# Patient Record
Sex: Male | Born: 1940 | Race: Asian | Hispanic: No | Marital: Married | Smoking: Never smoker
Health system: Southern US, Community
[De-identification: ages and names within clinical notes are randomized; demographics above are authoritative.]

## PROBLEM LIST (undated history)

## (undated) DIAGNOSIS — K219 Gastro-esophageal reflux disease without esophagitis: Secondary | ICD-10-CM

## (undated) DIAGNOSIS — I10 Essential (primary) hypertension: Secondary | ICD-10-CM

---

## 2008-02-03 ENCOUNTER — Inpatient Hospital Stay (HOSPITAL_COMMUNITY): Admission: EM | Admit: 2008-02-03 | Discharge: 2008-02-05 | Payer: Self-pay | Admitting: Internal Medicine

## 2008-07-02 ENCOUNTER — Encounter: Admission: RE | Admit: 2008-07-02 | Discharge: 2008-07-02 | Payer: Self-pay | Admitting: Pulmonary Disease

## 2008-09-16 ENCOUNTER — Encounter: Admission: RE | Admit: 2008-09-16 | Discharge: 2008-09-16 | Payer: Self-pay | Admitting: Family Medicine

## 2010-07-04 NOTE — Discharge Summary (Signed)
Eric Whitehead, GUM NO.:  [id number]   MEDICAL RECORD NO.:  [id number]          PATIENT TYPE:  INP   LOCATION:  1442                         FACILITY:  [facility]   PHYSICIAN:  Estelle Grumbles, MDDATE OF BIRTH:  1940/08/04   DATE OF ADMISSION:  02/03/2008  DATE OF DISCHARGE:  02/05/2008                               DISCHARGE SUMMARY   DISCHARGE DIAGNOSES:  1. Chest pain likely secondary to the gastroesophageal reflux disease.  2. Mild low back pain.   DISPOSITION:  To home.  Family care physician is going to be [facility].   FOLLOWUP:  March 16, 2008.   DISCHARGE MEDICATIONS:  1. Prilosec 20 mg orally twice a day.  2. Darvocet N-100 one tablet orally q.6 hourly p.r.n. for pain.  3. Maalox 30 mL p.o. q.6 hourly as needed for bloating sensation and      dyspepsia.   DISCHARGE RECOMMENDATIONS:  Increase oral fluid intake, and I have  recommended that patient return to the emergency room if he gets any  further episodes of chest pain or any weakness, tingling, numbness with  worsening back pain.   HOSPITAL COURSE:  Mr. Hargens presented to the emergency room with  intermittent chest discomfort.  He was complaining of having some  bloating sensation in the abdomen, and he was getting the chest pain  when his abdominal symptoms were getting worse.  On the day of  admission, he had some dyspnea along with this chest pain so they were  concerned  about his cardiac condition and decided to come to the  emergency room.  For complete history and physical, please review the H  and P done on December 15th.  The patient was admitted to the telemetry  unit.  Cardiac enzymes x3 were done, which came back negative.  His EKG  remained at normal sinus rhythm.  His telemetry did not reveal any  arrhythmia.  The patient continued to have intermittent chest discomfort  especially with a bloating sensation in the abdomen.  His chest CT  revealed nodular densities in the  bilateral lung fields recommending  followup CT.  The patient does have a history of tobacco use.  He needed  to have a repeat CT chest in the next 3 to 6 months.  His chest pain  seems to be secondary to the GI origin.  It is improving with acid  medication and Maalox.  We will discharge him home on antacids.  For his  low back pain, we will start him on Darvocet N-100.  We have arranged  followup care with [facility].      Estelle Grumbles, MD  Electronically Signed    TP/MEDQ  D:  02/05/2008  T:  02/06/2008  Job:  [id number]   cc:   [facility]

## 2010-07-04 NOTE — H&P (Signed)
[name], Whitehead NO.:  [id number]   MEDICAL RECORD NO.:  [id number]          PATIENT TYPE:  EMS   LOCATION:  MAJO                         FACILITY:  [facility]   PHYSICIAN:  Theodosia Paling, MD    DATE OF BIRTH:  01/14/1941   DATE OF ADMISSION:  02/03/2008  DATE OF DISCHARGE:                              HISTORY & PHYSICAL   PRIMARY CARE PHYSICIAN:  The patient has been in the [place] the  last one month and does not have a primary care physician at this time.   CHIEF COMPLAINT:  Chest pain.   HISTORY OF PRESENT ILLNESS:  The patient is from [place], recently moved  to [place] around a month back.  His history is getting translated by  his chaplain, who is standing next to him.  According to him, Mr. Eric Whitehead is  a 70 year old gentleman from [place] who traveled from [place] in a  prolonged  flight in the first week of November.  He has been  experiencing the sudden onset of chest pain yesterday which increases  when he takes a deep breath.  He tried to take some pain medication that  did not bring any relief.  He has mild shortness of breath associated  with that, as well.  He did not have something similar in the past.  Denies any other symptoms.  The patient also has a history of tobacco  abuse.  The patient has never seen a doctor in the past.  Because of the  persistent chest pain, he presented to the emergency room for further  evaluation and management and subsequently the [facility] hospitalist  team was consulted.   PAST MEDICAL HISTORY:  According to the translator, the patient does not  have any past medical condition.   PAST SURGICAL HISTORY:  None.   ALLERGIES:  None.   HOME MEDICATIONS:  None.   SOCIAL HISTORY:  Denies IV drug abuse or alcohol abuse.   FAMILY HISTORY:  Denies any family history of coronary artery disease,  hypertension or diabetes mellitus.   PHYSICAL EXAMINATION:  VITAL SIGNS:  Heart rate 64, respiratory rate 15-  18,  oxygen saturation 99% on room air, blood pressure 128/70.  GENERAL:  No acute cardiorespiratory distress.  HEENT:  Pupils equal, round and reactive to light.  Extraocular  movements are intact.  No ear or nose discharge.  LUNGS:  Normal breath sounds.  CARDIOVASCULAR:  S1 and S2 normal.  No murmur or gallop heard.  No rub  heard.  GI:  Soft, nontender.  No organomegaly.  Normal bowel sounds.  SKIN:  No rash.  EXTREMITIES:  No pedal edema.  PSYCHIATRIC:  Oriented x3.  CNS:  Follows commands.  Motor and sensory intact.   LABORATORY DATA:  WBC of 5.9, hemoglobin 13.2, hematocrit 39.2, platelet  count 210.  Sodium 141, potassium 4.1, chloride 105, glucose 84, BUN 11,  creatinine 1.2.  Cardiac enzymes - first set negative.  CK-MB less than  1.2.  Troponin less than 0.05.  Myoglobin 91.9.  Ionized calcium 1.18,  bicarbonate 27.  ASSESSMENT AND PLAN:  1. Chest pain.  The patient has pleuritic component of the pain, and      he recently had a pretty extensive flight, so we will rule him out      for pulmonary embolism.  He is a tobacco abuser.  Will also cycle      cardiac enzymes.  A low-dose of cardioprotective medication is      started, as well, including aspirin and a small dose of Coreg.  A      fasting lipid profile will be checked.  P.R.N. nitroglycerin and      p.r.n. morphine is requested.  At this time, the patient does not      appear to be in cardiorespiratory distress.  He will be admitted      under telemetry.  2. Tobacco abuse.  A nicotine patch will be supplied to the patient.  3. Prophylaxis.  DVT and GI prophylaxis on board.  4. Code status.  The patient is full code.   Total time spent on admission of this patient around 45 minutes.      Theodosia Paling, MD  Electronically Signed     NP/MEDQ  D:  02/03/2008  T:  02/03/2008  Job:  [id number]

## 2010-11-24 LAB — CBC
HCT: 36.8 % — ABNORMAL LOW (ref 39.0–52.0)
HCT: 37 % — ABNORMAL LOW (ref 39.0–52.0)
Hemoglobin: 12.4 g/dL — ABNORMAL LOW (ref 13.0–17.0)
Hemoglobin: 12.6 g/dL — ABNORMAL LOW (ref 13.0–17.0)
Hemoglobin: 13.2 g/dL (ref 13.0–17.0)
MCHC: 33.8 g/dL (ref 30.0–36.0)
MCV: 90 fL (ref 78.0–100.0)
MCV: 90.1 fL (ref 78.0–100.0)
Platelets: 188 10*3/uL (ref 150–400)
Platelets: 210 10*3/uL (ref 150–400)
RBC: 4.11 MIL/uL — ABNORMAL LOW (ref 4.22–5.81)
RDW: 12.5 % (ref 11.5–15.5)
RDW: 12.9 % (ref 11.5–15.5)
WBC: 5.9 10*3/uL (ref 4.0–10.5)
WBC: 7.1 10*3/uL (ref 4.0–10.5)

## 2010-11-24 LAB — BASIC METABOLIC PANEL
BUN: 13 mg/dL (ref 6–23)
CO2: 30 mEq/L (ref 19–32)
Chloride: 102 mEq/L (ref 96–112)
Chloride: 104 mEq/L (ref 96–112)
GFR calc Af Amer: >60 mL/min (ref >60)
GFR calc non Af Amer: >60 mL/min (ref >60)
GFR calc non Af Amer: >60 mL/min (ref >60)
Glucose, Bld: 104 mg/dL — ABNORMAL HIGH (ref 70–99)
Potassium: 3.7 mEq/L (ref 3.5–5.1)
Potassium: 4 mEq/L (ref 3.5–5.1)
Sodium: 137 mEq/L (ref 135–145)
Sodium: 138 mEq/L (ref 135–145)

## 2010-11-24 LAB — LIPID PANEL
HDL: 32 mg/dL — ABNORMAL LOW (ref >39)
Total CHOL/HDL Ratio: 6.5 RATIO
Triglycerides: 143 mg/dL (ref <150)

## 2010-11-24 LAB — CARDIAC PANEL(CRET KIN+CKTOT+MB+TROPI)
Relative Index: INVALID (ref 0.0–2.5)
Relative Index: INVALID (ref 0.0–2.5)
Total CK: 54 U/L (ref 7–232)
Total CK: 59 U/L (ref 7–232)
Troponin I: <0.01 ng/mL (ref 0.00–0.06)
Troponin I: <0.01 ng/mL (ref 0.00–0.06)

## 2010-11-24 LAB — DIFFERENTIAL
Basophils Absolute: 0 10*3/uL (ref 0.0–0.1)
Lymphocytes Relative: 37 % (ref 12–46)
Neutro Abs: 2.8 10*3/uL (ref 1.7–7.7)

## 2010-11-24 LAB — POCT I-STAT, CHEM 8
Chloride: 105 mEq/L (ref 96–112)
HCT: 40 % (ref 39.0–52.0)
Potassium: 4.1 mEq/L (ref 3.5–5.1)

## 2010-11-24 LAB — POCT CARDIAC MARKERS
CKMB, poc: <1 ng/mL — ABNORMAL LOW (ref 1.0–8.0)
Troponin i, poc: <0.05 ng/mL (ref 0.00–0.09)

## 2010-11-24 LAB — D-DIMER, QUANTITATIVE: D-Dimer, Quant: 0.4 ug/mL-FEU (ref 0.00–0.48)

## 2011-06-24 ENCOUNTER — Encounter (HOSPITAL_COMMUNITY): Payer: Self-pay | Admitting: Emergency Medicine

## 2011-06-24 ENCOUNTER — Emergency Department (HOSPITAL_COMMUNITY): Payer: Medicaid Other

## 2011-06-24 ENCOUNTER — Inpatient Hospital Stay (HOSPITAL_COMMUNITY)
Admission: EM | Admit: 2011-06-24 | Discharge: 2011-06-26 | DRG: 185 | Disposition: A | Discharge status: Home / Self Care | Payer: Medicaid Other | Attending: Internal Medicine | Admitting: Internal Medicine

## 2011-06-24 DIAGNOSIS — W010XXA Fall on same level from slipping, tripping and stumbling without subsequent striking against object, initial encounter: Secondary | ICD-10-CM | POA: Diagnosis present

## 2011-06-24 DIAGNOSIS — S2249XA Multiple fractures of ribs, unspecified side, initial encounter for closed fracture: Principal | ICD-10-CM | POA: Diagnosis present

## 2011-06-24 DIAGNOSIS — S2239XA Fracture of one rib, unspecified side, initial encounter for closed fracture: Secondary | ICD-10-CM

## 2011-06-24 DIAGNOSIS — Y93E1 Activity, personal bathing and showering: Secondary | ICD-10-CM

## 2011-06-24 DIAGNOSIS — Y999 Unspecified external cause status: Secondary | ICD-10-CM

## 2011-06-24 DIAGNOSIS — R55 Syncope and collapse: Secondary | ICD-10-CM | POA: Diagnosis present

## 2011-06-24 DIAGNOSIS — Z79899 Other long term (current) drug therapy: Secondary | ICD-10-CM

## 2011-06-24 DIAGNOSIS — Y92009 Unspecified place in unspecified non-institutional (private) residence as the place of occurrence of the external cause: Secondary | ICD-10-CM

## 2011-06-24 HISTORY — DX: Gastro-esophageal reflux disease without esophagitis: K21.9

## 2011-06-24 LAB — DIFFERENTIAL
Basophils Absolute: 0 10*3/uL (ref 0.0–0.1)
Basophils Relative: 0 % (ref 0–1)
Lymphocytes Relative: 21 % (ref 12–46)
Monocytes Absolute: 0.7 10*3/uL (ref 0.1–1.0)
Neutro Abs: 7.5 10*3/uL (ref 1.7–7.7)

## 2011-06-24 LAB — URINALYSIS, ROUTINE W REFLEX MICROSCOPIC
Ketones, ur: NEGATIVE mg/dL
Leukocytes, UA: NEGATIVE
Nitrite: NEGATIVE
Protein, ur: NEGATIVE mg/dL
pH: 7.5 (ref 5.0–8.0)

## 2011-06-24 LAB — BASIC METABOLIC PANEL
CO2: 27 mEq/L (ref 19–32)
Calcium: 9.2 mg/dL (ref 8.4–10.5)
Chloride: 97 mEq/L (ref 96–112)
Creatinine, Ser: 0.89 mg/dL (ref 0.50–1.35)
GFR calc Af Amer: >90 mL/min (ref >90)
Sodium: 132 mEq/L — ABNORMAL LOW (ref 135–145)

## 2011-06-24 LAB — POCT I-STAT, CHEM 8
Calcium, Ion: 1.23 mmol/L (ref 1.12–1.32)
Glucose, Bld: 100 mg/dL — ABNORMAL HIGH (ref 70–99)
HCT: 40 % (ref 39.0–52.0)
Hemoglobin: 13.6 g/dL (ref 13.0–17.0)
TCO2: 28 mmol/L (ref 0–100)

## 2011-06-24 LAB — CBC
HCT: 38.9 % — ABNORMAL LOW (ref 39.0–52.0)
MCHC: 33.9 g/dL (ref 30.0–36.0)
Platelets: 255 10*3/uL (ref 150–400)
RDW: 12.9 % (ref 11.5–15.5)
WBC: 10.7 10*3/uL — ABNORMAL HIGH (ref 4.0–10.5)

## 2011-06-24 MED ORDER — SODIUM CHLORIDE 0.9 % IV SOLN
Freq: Once | INTRAVENOUS | Status: AC
  Administered 2011-06-24: 22:00:00 via INTRAVENOUS

## 2011-06-24 MED ORDER — IOHEXOL 300 MG/ML  SOLN
100.0000 mL | Freq: Once | INTRAMUSCULAR | Status: AC | PRN
  Administered 2011-06-24: 100 mL via INTRAVENOUS

## 2011-06-24 MED ORDER — ONDANSETRON HCL 4 MG/2ML IJ SOLN
4.0000 mg | Freq: Once | INTRAMUSCULAR | Status: AC
  Administered 2011-06-24: 4 mg via INTRAVENOUS
  Filled 2011-06-24: qty 2

## 2011-06-24 MED ORDER — HYDROMORPHONE HCL PF 1 MG/ML IJ SOLN
1.0000 mg | Freq: Once | INTRAMUSCULAR | Status: AC
  Administered 2011-06-24: 1 mg via INTRAVENOUS
  Filled 2011-06-24: qty 1

## 2011-06-24 NOTE — ED Notes (Signed)
EKG performed and given to ERP Allen along with previous EKG 

## 2011-06-24 NOTE — H&P (Addendum)
PCP:  Billee Cashing with [facility]   Chief Complaint:  Fall  HPI: This is a 71 year old male with a no significant past medical history. he was in the shower today when he slipped and fell. He states he is a brief loss of consciousness after he fell. He was a non-witnessed fall. He quickly recovered and got help from family in the household. He had left side chest pains after the fall. The patient has fractured ribs. He states he did not hit his head. There is unclear history of possible chest pains prior to the fall. He reports no palpitations, no nausea or vomiting. He does report some shortness of breath. History provided by the patient through a translator his son-in-law. The patient speaks [place] and [place]. His son-in-law's name is Dom [phone].  Review of Systems: Positives bolded anorexia, fever, weight loss,, vision loss, decreased hearing, hoarseness, chest pain, syncope, dyspnea on exertion, peripheral edema, balance deficits, hemoptysis, abdominal pain, melena, hematochezia, severe indigestion/heartburn, hematuria, incontinence, genital sores, muscle weakness, suspicious skin lesions, transient blindness, difficulty walking, depression, unusual weight change, abnormal bleeding, enlarged lymph nodes, angioedema, and breast masses.  Past Medical History: Past Medical History  Diagnosis Date  . GERD (gastroesophageal reflux disease)    History reviewed. No pertinent past surgical history.  Medications: Prior to Admission medications   Medication Sig Start Date End Date Taking? Authorizing Provider  esomeprazole (NEXIUM) 40 MG capsule Take 40 mg by mouth daily before breakfast.   Yes Historical Provider, MD  fluticasone (VERAMYST) 27.5 MCG/SPRAY nasal spray Place 2 sprays into the nose daily. As directed   Yes Historical Provider, MD  loratadine (CLARITIN) 10 MG tablet Take 10 mg by mouth at bedtime.   Yes Historical Provider, MD    Pseudoeph-Doxylamine-DM-APAP (NYQUIL MULTI-SYMPTOM PO) Take 15 mLs by mouth every 6 (six) hours as needed. For cold   Yes Historical Provider, MD    Allergies:  No Known Allergies  Social History:  reports that he has never smoked. He does not have any smokeless tobacco history on file. He reports that he does not drink alcohol or use illicit drugs.  Family History: No family history on file.  Physical Exam: Filed Vitals:   06/24/11 2130 06/24/11 2145 06/24/11 2200 06/24/11 2215  BP:      Pulse: 80 80 78 79  Temp:      TempSrc:      Resp: 22 20 24 25   SpO2: 100% 100% 100% 100%    General:  Alert and oriented times three, well developed and nourished, no acute distress Eyes: PERRLA, pink conjunctiva, no scleral icterus ENT: Moist oral mucosa, neck supple, no thyromegaly Lungs: clear to ascultation, no wheeze, no crackles, no use of accessory muscles Cardiovascular: regular rate and rhythm, no regurgitation, no gallops, no murmurs. No carotid bruits, no JVD Abdomen: soft, positive BS, non-tender, non-distended, no organomegaly, not an acute abdomen GU: not examined Neuro: CN II - XII grossly intact, sensation intact Musculoskeletal: strength 5/5 all extremities, no clubbing, cyanosis or edema, tenderness to palpation left ribs  Skin: no rash, no subcutaneous crepitation, no decubitus Psych: appropriate patient   Labs on Admission:   [facility] 06/24/11 2111 06/24/11 2100  NA 137 132*  K 4.6 4.8  CL 104 97  CO2 -- 27  GLUCOSE 100* 97  BUN 19 18  CREATININE 1.00 0.89  CALCIUM -- 9.2  MG -- --  PHOS -- --   No results found for this basename: AST:2,ALT:2,ALKPHOS:2,BILITOT:2,PROT:2,ALBUMIN:2 in the  last 72 hours No results found for this basename: LIPASE:2,AMYLASE:2 in the last 72 hours  Basename 06/24/11 2111 06/24/11 2100  WBC -- 10.7*  NEUTROABS -- 7.5  HGB 13.6 13.2  HCT 40.0 38.9*  MCV -- 87.2  PLT -- 255   No results found for this basename:  CKTOTAL:3,CKMB:3,CKMBINDEX:3,TROPONINI:3 in the last 72 hours No components found with this basename: POCBNP:3 No results found for this basename: DDIMER:2 in the last 72 hours No results found for this basename: HGBA1C:2 in the last 72 hours No results found for this basename: CHOL:2,HDL:2,LDLCALC:2,TRIG:2,CHOLHDL:2,LDLDIRECT:2 in the last 72 hours No results found for this basename: TSH,T4TOTAL,FREET3,T3FREE,THYROIDAB in the last 72 hours No results found for this basename: VITAMINB12:2,FOLATE:2,FERRITIN:2,TIBC:2,IRON:2,RETICCTPCT:2 in the last 72 hours  Micro Results: No results found for this or any previous visit (from the past 240 hour(s)). Results for NANA, HOSELTON (MRN [id number]) as of 06/24/2011 23:21  Ref. Range 06/24/2011 22:46  Color, Urine Latest Range: YELLOW  YELLOW  APPearance Latest Range: CLEAR  CLEAR  Specific Gravity, Urine Latest Range: 1.005-1.030  1.023  pH Latest Range: 5.0-8.0  7.5  Glucose, UA Latest Range: NEGATIVE mg/dL NEGATIVE  Bilirubin Urine Latest Range: NEGATIVE  NEGATIVE  Ketones, ur Latest Range: NEGATIVE mg/dL NEGATIVE  Protein Latest Range: NEGATIVE mg/dL NEGATIVE  Urobilinogen, UA Latest Range: 0.0-1.0 mg/dL 0.2  Nitrite Latest Range: NEGATIVE  NEGATIVE  Leukocytes, UA Latest Range: NEGATIVE  NEGATIVE  Hgb urine dipstick Latest Range: NEGATIVE  NEGATIVE    Radiological Exams on Admission: Dg Ribs Unilateral W/chest Left  06/24/2011  *RADIOLOGY REPORT*  Clinical Data: Mid to lower left rib pain.  LEFT RIBS AND CHEST - 3+ VIEW  Comparison: Chest radiograph performed 07/02/2008, and CT of the chest performed 09/16/2008  Findings: There are displaced fractures of the left sixth and seventh lateral ribs, and left eighth through tenth anterolateral ribs.  Lung expansion is decreased.  Mild vascular crowding and vascular congestion are noted, without significant pulmonary edema.  Mild bibasilar atelectasis is seen.  No pleural effusion or pneumothorax is identified.   The cardiomediastinal silhouette is borderline normal in size.  No additional osseous abnormalities are identified.  IMPRESSION:  1.  Displaced fractures of the left sixth and seventh lateral ribs, and of the left eighth through tenth anterolateral ribs. 2.  Lungs hypoexpanded; mild vascular congestion noted, and mild bibasilar atelectasis seen.  Original Report Authenticated By: Tonia Ghent, M.D.   Ct Head Wo Contrast  06/24/2011  *RADIOLOGY REPORT*  Clinical Data: Headache; status post fall.  CT HEAD WITHOUT CONTRAST  Technique:  Contiguous axial images were obtained from the base of the skull through the vertex without contrast.  Comparison: None.  Findings: There is no evidence of acute infarction, mass lesion, or intra- or extra-axial hemorrhage on CT.  The posterior fossa, including the cerebellum, brainstem and fourth ventricle, is within normal limits.  The third and lateral ventricles, and basal ganglia are unremarkable in appearance.  The cerebral hemispheres are symmetric in appearance, with normal gray- white differentiation.  No mass effect or midline shift is seen.  There is no evidence of fracture; visualized osseous structures are unremarkable in appearance.  The orbits are within normal limits. The paranasal sinuses and mastoid air cells are well-aerated.  No significant soft tissue abnormalities are seen.  IMPRESSION: No evidence of traumatic intracranial injury or fracture.  Original Report Authenticated By: Tonia Ghent, M.D.   Ct Chest W Contrast  06/24/2011  *RADIOLOGY REPORT*  Clinical Data: Left-sided chest  pain after fall.  Rib fracture.  CT CHEST WITH CONTRAST  Technique:  Multidetector CT imaging of the chest was performed following the standard protocol during bolus administration of intravenous contrast.  Contrast: OMNIPAQUE IOHEXOL 300 MG/ML  SOLN  Comparison: Chest x-rays from earlier the same day.  CT chest from 09/16/2008.  Findings: There is no axillary, mediastinal,  or hilar lymphadenopathy.  Heart size is normal.  Coronary artery calcification is evident.  There is no pericardial or pleural effusion.  Lung windows show some pleural parenchymal scarring in the apices. No pneumothorax.  No focal airspace consolidation.  Calcified granuloma noted in the posterior right upper lobe.  Compressive atelectasis is seen in both lower lobes.  6 mm right lower lobe pulmonary nodules seen on image 32 is unchanged in the interval since the prior CT.  Bone windows reveal no worrisome lytic or sclerotic osseous lesions.  A segmental fracture of the anterior left seventh rib is identified.  There is a fracture of the anterior left sixth rib. The 8th through 12th ribs have not been completely visualized.  No evidence for thoracic spine fracture.  IMPRESSION: Acute fracture of the anterior left sixth rib.  There is an acute segmental fracture of the anterior left seventh rib.  The seventh rib is the lowest rib visualized in this region.  No evidence for associated left pneumothorax.  Original Report Authenticated By: ERIC A. MANSELL, M.D.    EKG: Normal sinus rhythm  Assessment/Plan Present on Admission:  .Syncope and collapse .Rib fracture Pain control Admit to telemetry Scheduled and when necessary medications Pulmonary toilet 2-D echo, carotid ultrasounds Cycle cardiac enzymes GERD Continue Protonix   Full code DVT prophylaxis Team 4/Dr. Loura Pardon, Lorrena Goranson 06/24/2011, 11:18 PM

## 2011-06-24 NOTE — ED Provider Notes (Addendum)
History     CSN: [id number]  Arrival date & time 06/24/11  4782   First MD Initiated Contact with Patient 06/24/11 1948      Chief Complaint  Patient presents with  . Chest Pain    (Consider location/radiation/quality/duration/timing/severity/associated sxs/prior treatment) Patient is a 71 y.o. male presenting with chest pain. The history is provided by the patient and a relative.  Chest Pain    patient here after slipping and falling in the shower striking the left chest. Patient lost consciousness for approximately 5 minutes. He denies headache or neck pain. Does note some left-sided chest pain worse with movement and inspiration. No prodromal symptoms prior to his slipping and falling. Denies abdominal pain or shoulder pain. No recent illnesses  History reviewed. No pertinent past medical history.  History reviewed. No pertinent past surgical history.  No family history on file.  History  Substance Use Topics  . Smoking status: Not on file  . Smokeless tobacco: Not on file  . Alcohol Use: No      Review of Systems  Cardiovascular: Positive for chest pain.  All other systems reviewed and are negative.    Allergies  Review of patient's allergies indicates no known allergies.  Home Medications   Current Outpatient Rx  Name Route Sig Dispense Refill  . ESOMEPRAZOLE MAGNESIUM 40 MG PO CPDR Oral Take 40 mg by mouth daily before breakfast.    . FLUTICASONE FUROATE 27.5 MCG/SPRAY NA SUSP Nasal Place 2 sprays into the nose daily. As directed    . LORATADINE 10 MG PO TABS Oral Take 10 mg by mouth at bedtime.    Doreatha Martin MULTI-SYMPTOM PO Oral Take 15 mLs by mouth every 6 (six) hours as needed. For cold      BP 128/84  Pulse 81  Temp(Src) 98.2 F (36.8 C) (Oral)  Resp 18  SpO2 100%  Physical Exam  Nursing note and vitals reviewed. Constitutional: He is oriented to person, place, and time. He appears well-developed and well-nourished.  Non-toxic appearance. No  distress.  HENT:  Head: Normocephalic and atraumatic.  Eyes: Conjunctivae, EOM and lids are normal. Pupils are equal, round, and reactive to light.  Neck: Normal range of motion. Neck supple. No spinous process tenderness and no muscular tenderness present. No tracheal deviation present. No mass present.  Cardiovascular: Normal rate, regular rhythm and normal heart sounds.  Exam reveals no gallop.   No murmur heard. Pulmonary/Chest: Effort normal and breath sounds normal. No stridor. No respiratory distress. He has no decreased breath sounds. He has no wheezes. He has no rhonchi. He has no rales. He exhibits tenderness. He exhibits no crepitus.    Abdominal: Soft. Normal appearance and bowel sounds are normal. He exhibits no distension. There is no tenderness. There is no rigidity, no rebound, no guarding and no CVA tenderness.  Musculoskeletal: Normal range of motion. He exhibits no edema and no tenderness.  Neurological: He is alert and oriented to person, place, and time. He has normal strength. No cranial nerve deficit or sensory deficit. GCS eye subscore is 4. GCS verbal subscore is 5. GCS motor subscore is 6.  Skin: Skin is warm and dry. No abrasion and no rash noted.  Psychiatric: He has a normal mood and affect. His speech is normal and behavior is normal.    ED Course  Procedures (including critical care time)  Labs Reviewed - No data to display No results found.   No diagnosis found.    MDM  Date: 06/24/2011  Rate: 79  Rhythm: normal sinus rhythm  QRS Axis: normal  Intervals: normal  ST/T Wave abnormalities: normal  Conduction Disutrbances:none  Narrative Interpretation:   Old EKG Reviewed: unchanged  10:46 PM Spoke with trauma surgeon and agrees to medical admission, pain meds given and pt to be admitted        Toy Baker, MD 06/24/11 2247  Toy Baker, MD 06/24/11 2249

## 2011-06-24 NOTE — ED Notes (Signed)
Per pt/family, fell in shower increased pain on left side

## 2011-06-24 NOTE — ED Notes (Signed)
Pt states he was taking a shower when he slipped and fell. Pt states he landed on his left side and c/o left rib/side pain. Pt denies hitting head or discomfort anywhere else in the body.

## 2011-06-25 ENCOUNTER — Encounter (HOSPITAL_COMMUNITY): Payer: Self-pay

## 2011-06-25 DIAGNOSIS — S2239XA Fracture of one rib, unspecified side, initial encounter for closed fracture: Secondary | ICD-10-CM

## 2011-06-25 DIAGNOSIS — R55 Syncope and collapse: Secondary | ICD-10-CM

## 2011-06-25 LAB — BASIC METABOLIC PANEL
CO2: 24 mEq/L (ref 19–32)
Chloride: 102 mEq/L (ref 96–112)
Creatinine, Ser: 0.81 mg/dL (ref 0.50–1.35)
GFR calc Af Amer: >90 mL/min (ref >90)
Sodium: 136 mEq/L (ref 135–145)

## 2011-06-25 LAB — CBC
HCT: 37.1 % — ABNORMAL LOW (ref 39.0–52.0)
MCV: 86.9 fL (ref 78.0–100.0)
Platelets: 222 10*3/uL (ref 150–400)
RBC: 4.27 MIL/uL (ref 4.22–5.81)
RDW: 13.2 % (ref 11.5–15.5)
WBC: 8.3 10*3/uL (ref 4.0–10.5)

## 2011-06-25 LAB — TROPONIN I
Troponin I: <0.3 ng/mL (ref <0.30)
Troponin I: <0.3 ng/mL (ref <0.30)

## 2011-06-25 MED ORDER — ACETAMINOPHEN 650 MG RE SUPP: 650.0000 mg | Freq: Four times a day (QID) | RECTAL | Status: DC | PRN

## 2011-06-25 MED ORDER — HYDROCODONE-ACETAMINOPHEN 5-325 MG PO TABS
1.0000 | ORAL_TABLET | ORAL | Status: DC | PRN
  Administered 2011-06-25 (×4): 2 via ORAL
  Filled 2011-06-25 (×4): qty 2

## 2011-06-25 MED ORDER — FLUTICASONE PROPIONATE 50 MCG/ACT NA SUSP
1.0000 | Freq: Every day | NASAL | Status: DC
  Administered 2011-06-25 – 2011-06-26 (×2): 1 via NASAL
  Filled 2011-06-25: qty 16

## 2011-06-25 MED ORDER — ALBUTEROL SULFATE (5 MG/ML) 0.5% IN NEBU: 2.5000 mg | INHALATION_SOLUTION | RESPIRATORY_TRACT | Status: DC | PRN

## 2011-06-25 MED ORDER — FLUTICASONE FUROATE 27.5 MCG/SPRAY NA SUSP: 2.0000 | Freq: Every day | NASAL | Status: DC

## 2011-06-25 MED ORDER — SODIUM CHLORIDE 0.9 % IJ SOLN
3.0000 mL | Freq: Two times a day (BID) | INTRAMUSCULAR | Status: DC
  Administered 2011-06-25 – 2011-06-26 (×3): 3 mL via INTRAVENOUS

## 2011-06-25 MED ORDER — IPRATROPIUM BROMIDE 0.02 % IN SOLN: 0.5000 mg | RESPIRATORY_TRACT | Status: DC | PRN

## 2011-06-25 MED ORDER — LORATADINE 10 MG PO TABS
10.0000 mg | ORAL_TABLET | Freq: Every day | ORAL | Status: DC
  Administered 2011-06-25: 10 mg via ORAL
  Filled 2011-06-25 (×2): qty 1

## 2011-06-25 MED ORDER — PANTOPRAZOLE SODIUM 40 MG PO TBEC
40.0000 mg | DELAYED_RELEASE_TABLET | Freq: Every day | ORAL | Status: DC
  Administered 2011-06-25 – 2011-06-26 (×2): 40 mg via ORAL
  Filled 2011-06-25 (×2): qty 1

## 2011-06-25 MED ORDER — MORPHINE SULFATE 2 MG/ML IJ SOLN: 2.0000 mg | INTRAMUSCULAR | Status: DC | PRN

## 2011-06-25 MED ORDER — ONDANSETRON HCL 4 MG/2ML IJ SOLN
4.0000 mg | Freq: Four times a day (QID) | INTRAMUSCULAR | Status: DC | PRN
  Administered 2011-06-25: 4 mg via INTRAVENOUS
  Filled 2011-06-25: qty 2

## 2011-06-25 MED ORDER — ONDANSETRON HCL 4 MG PO TABS: 4.0000 mg | ORAL_TABLET | Freq: Four times a day (QID) | ORAL | Status: DC | PRN

## 2011-06-25 MED ORDER — SODIUM CHLORIDE 0.9 % IJ SOLN: 3.0000 mL | INTRAMUSCULAR | Status: DC | PRN

## 2011-06-25 MED ORDER — ACETAMINOPHEN 325 MG PO TABS: 650.0000 mg | ORAL_TABLET | Freq: Four times a day (QID) | ORAL | Status: DC | PRN

## 2011-06-25 NOTE — Progress Notes (Signed)
VASCULAR LAB PRELIMINARY  PRELIMINARY  PRELIMINARY  PRELIMINARY  Carotid duplex  completed.    Preliminary report:  Bilateral:  No evidence of hemodynamically significant internal carotid artery stenosis.   Vertebral artery flow is antegrade.      Terance Hart, RVT 06/25/2011, 10:26 AM

## 2011-06-25 NOTE — Progress Notes (Signed)
   CARE MANAGEMENT NOTE 06/25/2011  Patient:  EDREES, VALENT   Account Number:  [id number]  Date Initiated:  06/25/2011  Documentation initiated by:  Lanier Clam  Subjective/Objective Assessment:   ADMITTED W/FALL.SYNCOPE     Action/Plan:   FROM HOME.DOES NOT SPEAK ENGLISH[place].INTEPRETER REFERRED.   Anticipated DC Date:  06/28/2011   Anticipated DC Plan:  HOME/SELF CARE  In-house referral  Interpreting Services         Choice offered to / List presented to:             Status of service:  In process, will continue to follow Medicare Important Message given?   (If response is "NO", the following Medicare IM given date fields will be blank) Date Medicare IM given:   Date Additional Medicare IM given:    Discharge Disposition:    Per UR Regulation:  Reviewed for med. necessity/level of care/duration of stay  If discussed at Long Length of Stay Meetings, dates discussed:    Comments:  06/25/11 [facility] RN,BSN [phone] [phone] ONSITE INTERPRETER TO ASSIST W/COMMUNICATION OF PLANS.ALSO TELEPHONIC INTERPRETING INFO FOR COMMUNICATION ON CHART IF NEEDED.MD/STAFF UPDATED.

## 2011-06-25 NOTE — Progress Notes (Signed)
*  PRELIMINARY RESULTS* Echocardiogram 2D Echocardiogram has been performed.  Eric Whitehead R 06/25/2011, 11:24 AM

## 2011-06-25 NOTE — Evaluation (Signed)
Physical Therapy Evaluation Patient Details Name: Eric Whitehead MRN: [id number] DOB: 02-02-1941 Today's Date: 06/25/2011 Time: 4098-1191 PT Time Calculation (min): 18 min  PT Assessment / Plan / Recommendation Clinical Impression  71 y.o. with fall in shower and multiple L rib fxs, presents with pain in L ribs which limits mobility. Instructed pt to brace ribs with pillow. Expect pt can DC home without f/u PT. Lives with supportive family. Checked orthostatic BP which was normal.    PT Assessment  Patient needs continued PT services    Follow Up Recommendations  No PT follow up    Equipment Recommendations  None recommended by PT    Frequency Min 3X/week    Precautions / Restrictions Precautions Precautions: Fall Precaution Comments: admitted with fall, family states only other fall was 4 yrs ago when pt slipped    Pertinent Vitals/Pain *2/10 L ribs, instructed in bracing with pillow**      Mobility  Bed Mobility Bed Mobility: Supine to Sit Supine to Sit: 4: Min assist;With rails;HOB flat Details for Bed Mobility Assistance: pt 80 %, limited due to pain, assist to elevate trunk Transfers Transfers: Sit to Stand;Stand to Sit Sit to Stand: 6: Modified independent (Device/Increase time);With upper extremity assist;From bed Stand to Sit: 6: Modified independent (Device/Increase time);With upper extremity assist;To chair/3-in-1 Ambulation/Gait Ambulation/Gait Assistance: 5: Supervision Ambulation Distance (Feet): 200 Feet Assistive device: None Ambulation/Gait Assistance Details: steady, no LOB Gait Pattern: Within Functional Limits    Exercises     PT Goals Acute Rehab PT Goals PT Goal Formulation: With patient/family Time For Goal Achievement: 07/02/11 Potential to Achieve Goals: Good Pt will go Supine/Side to Sit: Independently PT Goal: Supine/Side to Sit - Progress: Goal set today Pt will go Sit to Stand: Independently PT Goal: Sit to Stand - Progress: Goal set  today Pt will Perform Home Exercise Program: Independently PT Goal: Perform Home Exercise Program - Progress: Goal set today  Visit Information  Last PT Received On: 06/25/11 Assistance Needed: +1    Subjective Data  Subjective: Pt indicated L ribs are sore thru interpreter Patient Stated Goal: decrease pain   Prior Functioning  Home Living Lives With: Spouse Available Help at Discharge: Family Home Layout: One level Prior Function Level of Independence: Independent Able to Take Stairs?: Yes Communication Communication: Prefers language other than [place];Interpreter utilized Dominant Hand: Right    Cognition  Overall Cognitive Status: Appears within functional limits for tasks assessed/performed Arousal/Alertness: Awake/alert Orientation Level: Appears intact for tasks assessed Behavior During Session: [facility] for tasks performed    Extremity/Trunk Assessment Right Upper Extremity Assessment RUE ROM/Strength/Tone: Within functional levels RUE Sensation: WFL - Light Touch RUE Coordination: WFL - gross/fine motor Left Upper Extremity Assessment LUE ROM/Strength/Tone: Due to pain;Unable to fully assess LUE Sensation: WFL - Light Touch LUE Coordination: WFL - gross/fine motor Right Lower Extremity Assessment RLE ROM/Strength/Tone: Within functional levels RLE Sensation: WFL - Light Touch RLE Coordination: WFL - gross/fine motor Left Lower Extremity Assessment LLE ROM/Strength/Tone: Within functional levels LLE Sensation: WFL - Light Touch LLE Coordination: WFL - gross/fine motor Trunk Assessment Trunk Assessment: Normal   Balance    End of Session PT - End of Session Activity Tolerance: Patient tolerated treatment well Patient left: in chair;with call bell/phone within reach;with family/visitor present Nurse Communication: Mobility status   Ralene Bathe Kistler 06/25/2011, 1:27 PM  [phone]

## 2011-06-25 NOTE — Progress Notes (Signed)
Subjective: Sitting up in bed, smiling. Indicates pain L side. Speaks very little English  Objective: Vital signs Filed Vitals:   06/24/11 2345 06/25/11 0000 06/25/11 0015 06/25/11 0105  BP:    135/81  Pulse:    78  Temp:    97.8 F (36.6 C)  TempSrc:    Oral  Resp: 20 20 23 20   Height:    5\' 6"  (1.676 m)  Weight:    70.1 kg (154 lb 8.7 oz)  SpO2:    99%   Weight change:  Last BM Date: 06/24/11  Intake/Output from previous day: 05/05 0701 - 05/06 0700 In: 240 [P.O.:240] Out: 400 [Urine:400]     Physical Exam: General: Alert, awake, seems oriented x3, in no acute distress. Appears younger than age. Well nourished.  HEENT: No bruits, no goiter. Mucus membranes moist/pink. PERRL Heart: Regular rate and rhythm, without murmurs, rubs, gallops. Lungs:  Normal effort but a little shallow Clear to auscultation bilaterally. Abdomen: Soft,  nondistended, positive bowel sounds. Mild tenderness left side to palpation.  Extremities: No clubbing cyanosis or edema with positive pedal pulses. Neuro: Grossly intact, nonfocal.    Lab Results: Basic Metabolic Panel:  Basename 06/25/11 0610 06/24/11 2111 06/24/11 2100  NA 136 137 --  K 3.9 4.6 --  CL 102 104 --  CO2 24 -- 27  GLUCOSE 100* 100* --  BUN 13 19 --  CREATININE 0.81 1.00 --  CALCIUM 9.3 -- 9.2  MG -- -- --  PHOS -- -- --   Liver Function Tests: No results found for this basename: AST:2,ALT:2,ALKPHOS:2,BILITOT:2,PROT:2,ALBUMIN:2 in the last 72 hours No results found for this basename: LIPASE:2,AMYLASE:2 in the last 72 hours No results found for this basename: AMMONIA:2 in the last 72 hours CBC:  Basename 06/25/11 0610 06/24/11 2111 06/24/11 2100  WBC 8.3 -- 10.7*  NEUTROABS -- -- 7.5  HGB 12.5* 13.6 --  HCT 37.1* 40.0 --  MCV 86.9 -- 87.2  PLT 222 -- 255   Cardiac Enzymes:  Basename 06/25/11 0610 06/25/11 0120  CKTOTAL -- --  CKMB -- --  CKMBINDEX -- --  TROPONINI <0.30 <0.30   BNP: No results found  for this basename: PROBNP:3 in the last 72 hours D-Dimer: No results found for this basename: DDIMER:2 in the last 72 hours CBG: No results found for this basename: GLUCAP:6 in the last 72 hours Hemoglobin A1C: No results found for this basename: HGBA1C in the last 72 hours Fasting Lipid Panel: No results found for this basename: CHOL,HDL,LDLCALC,TRIG,CHOLHDL,LDLDIRECT in the last 72 hours Thyroid Function Tests: No results found for this basename: TSH,T4TOTAL,FREET4,T3FREE,THYROIDAB in the last 72 hours Anemia Panel: No results found for this basename: VITAMINB12,FOLATE,FERRITIN,TIBC,IRON,RETICCTPCT in the last 72 hours Coagulation: No results found for this basename: LABPROT:2,INR:2 in the last 72 hours Urine Drug Screen: Drugs of Abuse  No results found for this basename: labopia, cocainscrnur, labbenz, amphetmu, thcu, labbarb    Alcohol Level: No results found for this basename: ETH:2 in the last 72 hours Urinalysis:  Basename 06/24/11 2246  COLORURINE YELLOW  LABSPEC 1.023  PHURINE 7.5  GLUCOSEU NEGATIVE  HGBUR NEGATIVE  BILIRUBINUR NEGATIVE  KETONESUR NEGATIVE  PROTEINUR NEGATIVE  UROBILINOGEN 0.2  NITRITE NEGATIVE  LEUKOCYTESUR NEGATIVE   Misc. Labs:  No results found for this or any previous visit (from the past 240 hour(s)).  Studies/Results: Dg Ribs Unilateral W/chest Left  06/24/2011  *RADIOLOGY REPORT*  Clinical Data: Mid to lower left rib pain.  LEFT RIBS AND CHEST -  3+ VIEW  Comparison: Chest radiograph performed 07/02/2008, and CT of the chest performed 09/16/2008  Findings: There are displaced fractures of the left sixth and seventh lateral ribs, and left eighth through tenth anterolateral ribs.  Lung expansion is decreased.  Mild vascular crowding and vascular congestion are noted, without significant pulmonary edema.  Mild bibasilar atelectasis is seen.  No pleural effusion or pneumothorax is identified.  The cardiomediastinal silhouette is borderline normal  in size.  No additional osseous abnormalities are identified.  IMPRESSION:  1.  Displaced fractures of the left sixth and seventh lateral ribs, and of the left eighth through tenth anterolateral ribs. 2.  Lungs hypoexpanded; mild vascular congestion noted, and mild bibasilar atelectasis seen.  Original Report Authenticated By: Tonia Ghent, M.D.   Ct Head Wo Contrast  06/24/2011  *RADIOLOGY REPORT*  Clinical Data: Headache; status post fall.  CT HEAD WITHOUT CONTRAST  Technique:  Contiguous axial images were obtained from the base of the skull through the vertex without contrast.  Comparison: None.  Findings: There is no evidence of acute infarction, mass lesion, or intra- or extra-axial hemorrhage on CT.  The posterior fossa, including the cerebellum, brainstem and fourth ventricle, is within normal limits.  The third and lateral ventricles, and basal ganglia are unremarkable in appearance.  The cerebral hemispheres are symmetric in appearance, with normal gray- white differentiation.  No mass effect or midline shift is seen.  There is no evidence of fracture; visualized osseous structures are unremarkable in appearance.  The orbits are within normal limits. The paranasal sinuses and mastoid air cells are well-aerated.  No significant soft tissue abnormalities are seen.  IMPRESSION: No evidence of traumatic intracranial injury or fracture.  Original Report Authenticated By: Tonia Ghent, M.D.   Ct Chest W Contrast  06/24/2011  *RADIOLOGY REPORT*  Clinical Data: Left-sided chest pain after fall.  Rib fracture.  CT CHEST WITH CONTRAST  Technique:  Multidetector CT imaging of the chest was performed following the standard protocol during bolus administration of intravenous contrast.  Contrast: OMNIPAQUE IOHEXOL 300 MG/ML  SOLN  Comparison: Chest x-rays from earlier the same day.  CT chest from 09/16/2008.  Findings: There is no axillary, mediastinal, or hilar lymphadenopathy.  Heart size is normal.   Coronary artery calcification is evident.  There is no pericardial or pleural effusion.  Lung windows show some pleural parenchymal scarring in the apices. No pneumothorax.  No focal airspace consolidation.  Calcified granuloma noted in the posterior right upper lobe.  Compressive atelectasis is seen in both lower lobes.  6 mm right lower lobe pulmonary nodules seen on image 32 is unchanged in the interval since the prior CT.  Bone windows reveal no worrisome lytic or sclerotic osseous lesions.  A segmental fracture of the anterior left seventh rib is identified.  There is a fracture of the anterior left sixth rib. The 8th through 12th ribs have not been completely visualized.  No evidence for thoracic spine fracture.  IMPRESSION: Acute fracture of the anterior left sixth rib.  There is an acute segmental fracture of the anterior left seventh rib.  The seventh rib is the lowest rib visualized in this region.  No evidence for associated left pneumothorax.  Original Report Authenticated By: ERIC A. MANSELL, M.D.    Medications: Scheduled Meds:   . sodium chloride   Intravenous Once  . fluticasone  1 spray Each Nare Daily  . HYDROmorphone  1 mg Intravenous Once  . loratadine  10 mg Oral QHS  .  ondansetron  4 mg Intravenous Once  . pantoprazole  40 mg Oral Daily  . sodium chloride  3 mL Intravenous Q12H  . DISCONTD: fluticasone  2 spray Nasal Daily   Continuous Infusions:  PRN Meds:.acetaminophen, acetaminophen, albuterol, HYDROcodone-acetaminophen, iohexol, ipratropium, morphine injection, ondansetron (ZOFRAN) IV, ondansetron, sodium chloride  Assessment/Plan:  Principal Problem:  *Syncope and collapse Active Problems:  Rib fracture  Syncope and collapse: No witness. No further episodes. Tele with SR. Carotid US and 2decho pending. CE neg to date. Will monitor. PT/OT .Rib fracture : pain control. Monitor resp. Effort/sats.   GERD  Continue Protonix  Dispo: lives at home. Will discharge  likely 24-48 hours.      LOS: 1 day   [facility] M 06/25/2011, 8:30 AM

## 2011-06-26 DIAGNOSIS — R55 Syncope and collapse: Secondary | ICD-10-CM

## 2011-06-26 DIAGNOSIS — S2239XA Fracture of one rib, unspecified side, initial encounter for closed fracture: Secondary | ICD-10-CM

## 2011-06-26 LAB — URINE CULTURE

## 2011-06-26 MED ORDER — MORPHINE SULFATE 2 MG/ML IJ SOLN
1.0000 mg | Freq: Once | INTRAMUSCULAR | Status: AC
  Administered 2011-06-26: 1 mg via INTRAVENOUS
  Filled 2011-06-26: qty 1

## 2011-06-26 MED ORDER — ESOMEPRAZOLE MAGNESIUM 40 MG PO PACK: 40.0000 mg | PACK | Freq: Every day | ORAL | Status: DC

## 2011-06-26 MED ORDER — ESOMEPRAZOLE MAGNESIUM 20 MG PO CPDR: 20.0000 mg | DELAYED_RELEASE_CAPSULE | Freq: Every day | ORAL | Status: DC

## 2011-06-26 MED ORDER — LORAZEPAM 1 MG PO TABS
1.0000 mg | ORAL_TABLET | Freq: Once | ORAL | Status: AC
  Administered 2011-06-26: 1 mg via ORAL
  Filled 2011-06-26: qty 1

## 2011-06-26 MED ORDER — CALCIUM CARBONATE-VITAMIN D 500-200 MG-UNIT PO TABS: 1.0000 | ORAL_TABLET | Freq: Every day | ORAL | Status: DC

## 2011-06-26 MED ORDER — HYDROCODONE-ACETAMINOPHEN 5-325 MG PO TABS: 1.0000 | ORAL_TABLET | Freq: Four times a day (QID) | ORAL | Status: AC | PRN

## 2011-06-26 NOTE — ED Notes (Signed)
Floor 4 inpatient unit called for patient wallet phone called transferred to [facility], the nurse that was taking care of him last night.

## 2011-06-26 NOTE — Progress Notes (Signed)
   CARE MANAGEMENT NOTE 06/26/2011  Patient:  Eric Whitehead, Eric Whitehead   Account Number:  [id number]  Date Initiated:  06/25/2011  Documentation initiated by:  Lanier Clam  Subjective/Objective Assessment:   ADMITTED W/FALL.SYNCOPE     Action/Plan:   FROM HOME.DOES NOT SPEAK ENGLISH[place].INTEPRETER REFERRED.   Anticipated DC Date:  06/26/2011   Anticipated DC Plan:  HOME/SELF CARE  In-house referral  Interpreting Services         Choice offered to / List presented to:             Status of service:  Completed, signed off Medicare Important Message given?   (If response is "NO", the following Medicare IM given date fields will be blank) Date Medicare IM given:   Date Additional Medicare IM given:    Discharge Disposition:  HOME/SELF CARE  Per UR Regulation:  Reviewed for med. necessity/level of care/duration of stay  If discussed at Long Length of Stay Meetings, dates discussed:    Comments:  06/26/11 [name] RN,BSN NCM [phone] ON SITE INTERPRETER USED:[facility] CHOSEN FOR RW.SUSAN ([place]) INFORMED ABOUT D/C.F/U ON MEDICAID CARD STATUS W/ADMITTING,THEY INFORMED ME THAT PATIENT'S CARD WAS A 2009 MEDICAID CARD SCANNED IN ON MARCH OF 2012,THEY DID NOT HAVE A COPY OF A NEW CARD,& THEY E VERIFIED THAT HE WAS MEDICAID ELIGIBLE THIS ADMISSION. INTERPRETER USED TODAY FOR D/C PLANS.  06/25/11 [name] RN,BSN NCM [phone] ONSITE INTERPRETER TO ASSIST W/COMMUNICATION OF PLANS.ALSO TELEPHONIC INTERPRETING INFO FOR COMMUNICATION ON CHART IF NEEDED.MD/STAFF UPDATED.

## 2011-06-26 NOTE — Discharge Summary (Signed)
Pt was seen and examined at bedside. I have reviewed labs and vitals which are stable and pt is hemodynamically stable. I agree with the above's assessment and plan. Pt is stable for discharge. Interpreter present in the room, all questions and concerns addressed.  Debbora Presto  [facility], pager #: [phone] Main office number: [phone]

## 2011-06-26 NOTE — Discharge Summary (Signed)
Physician Discharge Summary  Patient ID: Eric Whitehead MRN: [id number] DOB/AGE: 73/26/1942 73 y.o.  Admit date: 06/24/2011 Discharge date: 06/26/2011  Primary Care Physician:  Dani Gobble, [facility]  Discharge Diagnoses:    Principal Problem:  *Syncope and collapse Active Problems:  Rib fracture   Medication List  As of 06/26/2011 10:20 AM   STOP taking these medications         NYQUIL MULTI-SYMPTOM PO         TAKE these medications         esomeprazole 40 MG capsule   Commonly known as: NEXIUM   Take 40 mg by mouth daily before breakfast.      fluticasone 27.5 MCG/SPRAY nasal spray   Commonly known as: VERAMYST   Place 2 sprays into the nose daily. As directed      HYDROcodone-acetaminophen 5-325 MG per tablet   Commonly known as: NORCO   Take 1-2 tablets by mouth every 6 (six) hours as needed.      loratadine 10 MG tablet   Commonly known as: CLARITIN   Take 10 mg by mouth at bedtime.             Disposition and Follow-up: Pt medically stable and ready for discharge to home. Will follow up with PCP 1 week  Consults:  None   Significant Diagnostic Studies:  Dg Ribs Unilateral W/chest Left  06/24/2011  *RADIOLOGY REPORT*  Clinical Data: Mid to lower left rib pain.  LEFT RIBS AND CHEST - 3+ VIEW  Comparison: Chest radiograph performed 07/02/2008, and CT of the chest performed 09/16/2008  Findings: There are displaced fractures of the left sixth and seventh lateral ribs, and left eighth through tenth anterolateral ribs.  Lung expansion is decreased.  Mild vascular crowding and vascular congestion are noted, without significant pulmonary edema.  Mild bibasilar atelectasis is seen.  No pleural effusion or pneumothorax is identified.  The cardiomediastinal silhouette is borderline normal in size.  No additional osseous abnormalities are identified.  IMPRESSION:  1.  Displaced fractures of the left sixth and seventh lateral ribs, and of the left eighth  through tenth anterolateral ribs. 2.  Lungs hypoexpanded; mild vascular congestion noted, and mild bibasilar atelectasis seen.  Original Report Authenticated By: Tonia Ghent, M.D.   Ct Head Wo Contrast  06/24/2011  *RADIOLOGY REPORT*  Clinical Data: Headache; status post fall.  CT HEAD WITHOUT CONTRAST  Technique:  Contiguous axial images were obtained from the base of the skull through the vertex without contrast.  Comparison: None.  Findings: There is no evidence of acute infarction, mass lesion, or intra- or extra-axial hemorrhage on CT.  The posterior fossa, including the cerebellum, brainstem and fourth ventricle, is within normal limits.  The third and lateral ventricles, and basal ganglia are unremarkable in appearance.  The cerebral hemispheres are symmetric in appearance, with normal gray- white differentiation.  No mass effect or midline shift is seen.  There is no evidence of fracture; visualized osseous structures are unremarkable in appearance.  The orbits are within normal limits. The paranasal sinuses and mastoid air cells are well-aerated.  No significant soft tissue abnormalities are seen.  IMPRESSION: No evidence of traumatic intracranial injury or fracture.  Original Report Authenticated By: Tonia Ghent, M.D.   Ct Chest W Contrast  06/24/2011  *RADIOLOGY REPORT*  Clinical Data: Left-sided chest pain after fall.  Rib fracture.  CT CHEST WITH CONTRAST  Technique:  Multidetector CT imaging of the chest was performed following  the standard protocol during bolus administration of intravenous contrast.  Contrast: OMNIPAQUE IOHEXOL 300 MG/ML  SOLN  Comparison: Chest x-rays from earlier the same day.  CT chest from 09/16/2008.  Findings: There is no axillary, mediastinal, or hilar lymphadenopathy.  Heart size is normal.  Coronary artery calcification is evident.  There is no pericardial or pleural effusion.  Lung windows show some pleural parenchymal scarring in the apices. No pneumothorax.   No focal airspace consolidation.  Calcified granuloma noted in the posterior right upper lobe.  Compressive atelectasis is seen in both lower lobes.  6 mm right lower lobe pulmonary nodules seen on image 32 is unchanged in the interval since the prior CT.  Bone windows reveal no worrisome lytic or sclerotic osseous lesions.  A segmental fracture of the anterior left seventh rib is identified.  There is a fracture of the anterior left sixth rib. The 8th through 12th ribs have not been completely visualized.  No evidence for thoracic spine fracture.  IMPRESSION: Acute fracture of the anterior left sixth rib.  There is an acute segmental fracture of the anterior left seventh rib.  The seventh rib is the lowest rib visualized in this region.  No evidence for associated left pneumothorax.  Original Report Authenticated By: ERIC A. MANSELL, M.D.    Labs Reviewed  POCT I-STAT, CHEM 8 - Abnormal; Notable for the following:    Glucose, Bld 100 (*)    All other components within normal limits  CBC - Abnormal; Notable for the following:    WBC 10.7 (*)    HCT 38.9 (*)    All other components within normal limits  BASIC METABOLIC PANEL - Abnormal; Notable for the following:    Sodium 132 (*)    GFR calc non Af Amer 83 (*)    All other components within normal limits  BASIC METABOLIC PANEL - Abnormal; Notable for the following:    Glucose, Bld 100 (*)    GFR calc non Af Amer 87 (*)    All other components within normal limits  CBC - Abnormal; Notable for the following:    Hemoglobin 12.5 (*)    HCT 37.1 (*)    All other components within normal limits  DIFFERENTIAL  URINALYSIS, ROUTINE W REFLEX MICROSCOPIC  URINE CULTURE  TROPONIN I  TROPONIN I  TROPONIN I        Brief H and P: For complete details please refer to admission H and P, but in brief   This is a 71 year old male with a no significant past medical history. he was in the shower on 06/24/11 when he slipped and fell. He stated he had a  brief loss of consciousness after he fell. He was a non-witnessed fall. He quickly recovered and got help from family in the household. He had left side chest pains after the fall. The patient has fractured ribs. He stated he did not hit his head. There is unclear history of possible chest pains prior to the fall. He reportedno palpitations, no nausea or vomiting. He did report some shortness of breath. History provided by the patient through a translator his son-in-law. The patient speaks [place] and [place]. His son-in-law's name is Dom [phone]. Pt admitted to medicine service for possible syncope work up.      Hospital Course:   Principal Problem:  *Syncope and collapse Active Problems:  Rib fracture Syncope and collapse:  Patient reported that he tripped on his "flip flops".  No witness. Admitted to  telemetry.  No further episodes. Tele with SR. Carotid US yields no stenosis  and 2decho yields EF 60-65%. CE neg. CT of head as above. Ct of chest as above. No orthostatic hypotension.  Pt seen by PT and OT. Who had no recommendations for discharge. At time of discharge pt moving slowly due to pain from below. No dizziness. Will see PCP 07/03/11.  .Rib fracture : pain control. Chest imaging as above. At discharge reports pain still. Explained need for continued movement and deep inspiration with translator. Educated to pain meds and directions.   GERD   Remained at baseline at discharge. Continue Protonix    Time spent on Discharge: 30 minutes  Signed: Gwenyth Bender 06/26/2011, 10:20 AM

## 2011-06-26 NOTE — Discharge Instructions (Addendum)
Rib Fracture The ribs are like a cage that goes around your upper chest. The ribs protect your lungs. Your ribs move when you breathe, so it hurts if a rib is broken. HOME CARE  Avoid activities that cause pain to the injured area. Protect your injured area.   Eat a normal, healthy diet.   Drink enough fluids to keep your pee (urine) clear or pale yellow.   Take deep breaths many times a day. Cough many times a day while hugging a pillow.   Do not wear a rib belt or binder on the chest unless told by your doctor. Rib belts or binders do not allow you to breathe deeply.   Only take medicine as told by your doctor.  GET HELP RIGHT AWAY IF:   You have a fever.   You cannot stop coughing or cough up thick or bloody spit (mucus).   You have trouble breathing.   You feel sick to your stomach (nauseous), throw up (vomit), or have belly (abdominal) pain.   Your pain gets worse and medicine does not help.  MAKE SURE YOU:   Understand these instructions.   Will watch this condition.   Will get help right away if you are not doing well or get worse.  Document Released: 11/15/2007 Document Revised: 01/25/2011 Document Reviewed: 11/15/2007 [employer] Patient Information 2012 [place], [place].Syncope Syncope (fainting) is a sudden, short loss of consciousness. People normally fall to the ground when they faint. Recovery is often fast. HOME CARE  Do not drive or use machines. Wait until your doctor says it is safe to do so.   If you have diabetes, check your blood sugar. If it is low (below 70), you need to drink or eat something sweet. If over 300, call your doctor.   If you have a blood pressure machine at home, take your blood pressure. If the top number is below 100 or above 170, call your doctor.   Lie down until you feel normal.   Drink extra fluids (water, juice, soup).  GET HELP RIGHT AWAY IF:   You pass out (faint) when sitting or lying down. Do not drive. Call your local  emergency services (911 in [place] if no one is there to help you.   There is chest pain.   You feel sick to your stomach (nauseous) or keep throwing up (vomiting).   You have very bad belly (abdominal) pain.   You feel your heartbeat is fast or not normal.   You lose feeling in some part of the body.   You cannot move your arms or legs.   You cannot talk well and get confused.   You feel weak or cannot see well.   You get sweaty and feel lightheaded.  MAKE SURE YOU:   Understand these instructions.   Will watch your condition.   Will get help right away if you are not doing well or get worse.  Document Released: 07/25/2007 Document Revised: 01/25/2011 Document Reviewed: 07/25/2007 [facility] Patient Information 2012 [place], [place].

## 2011-06-26 NOTE — Progress Notes (Signed)
   CARE MANAGEMENT NOTE 06/26/2011  Patient:  Eric Whitehead, Eric Whitehead   Account Number:  [id number]  Date Initiated:  06/25/2011  Documentation initiated by:  Lanier Clam  Subjective/Objective Assessment:   ADMITTED W/FALL.SYNCOPE     Action/Plan:   FROM HOME.DOES NOT SPEAK ENGLISH-[place]ESE.INTEPRETER REFERRED.   Anticipated DC Date:  06/26/2011   Anticipated DC Plan:  HOME/SELF CARE  In-house referral  Interpreting Services         Choice offered to / List presented to:             Status of service:  Completed, signed off Medicare Important Message given?   (If response is "NO", the following Medicare IM given date fields will be blank) Date Medicare IM given:   Date Additional Medicare IM given:    Discharge Disposition:  HOME/SELF CARE  Per UR Regulation:  Reviewed for med. necessity/level of care/duration of stay  If discussed at Long Length of Stay Meetings, dates discussed:    Comments:  06/26/11 Shaden Higley RN,BSN NCM [phone] INTERPRETER USED TODAY FOR D/C PLANS.  06/25/11 Delisa Finck RN,BSN NCM [phone] ONSITE INTERPRETER TO ASSIST W/COMMUNICATION OF PLANS.ALSO TELEPHONIC INTERPRETING INFO FOR COMMUNICATION ON CHART IF NEEDED.MD/STAFF UPDATED.

## 2012-01-27 ENCOUNTER — Emergency Department (HOSPITAL_COMMUNITY)
Admission: EM | Admit: 2012-01-27 | Discharge: 2012-01-27 | Disposition: A | Discharge status: Home / Self Care | Payer: Medicaid Other | Attending: Emergency Medicine | Admitting: Emergency Medicine

## 2012-01-27 ENCOUNTER — Emergency Department (HOSPITAL_COMMUNITY): Payer: Medicaid Other

## 2012-01-27 ENCOUNTER — Encounter (HOSPITAL_COMMUNITY): Payer: Self-pay | Admitting: *Deleted

## 2012-01-27 DIAGNOSIS — R059 Cough, unspecified: Secondary | ICD-10-CM | POA: Insufficient documentation

## 2012-01-27 DIAGNOSIS — Z79899 Other long term (current) drug therapy: Secondary | ICD-10-CM | POA: Insufficient documentation

## 2012-01-27 DIAGNOSIS — K219 Gastro-esophageal reflux disease without esophagitis: Secondary | ICD-10-CM | POA: Insufficient documentation

## 2012-01-27 DIAGNOSIS — R05 Cough: Secondary | ICD-10-CM | POA: Insufficient documentation

## 2012-01-27 DIAGNOSIS — J40 Bronchitis, not specified as acute or chronic: Secondary | ICD-10-CM | POA: Insufficient documentation

## 2012-01-27 MED ORDER — BENZONATATE 100 MG PO CAPS: 100.0000 mg | ORAL_CAPSULE | Freq: Three times a day (TID) | ORAL | Status: DC

## 2012-01-27 MED ORDER — HYDROCOD POLST-CHLORPHEN POLST 10-8 MG/5ML PO LQCR
5.0000 mL | Freq: Once | ORAL | Status: AC
  Administered 2012-01-27: 5 mL via ORAL
  Filled 2012-01-27: qty 5

## 2012-01-27 MED ORDER — HYDROCOD POLST-CHLORPHEN POLST 10-8 MG/5ML PO LQCR: 5.0000 mL | Freq: Two times a day (BID) | ORAL | Status: DC

## 2012-01-27 MED ORDER — GUAIFENESIN ER 600 MG PO TB12: 1200.0000 mg | ORAL_TABLET | Freq: Two times a day (BID) | ORAL | Status: DC

## 2012-01-27 MED ORDER — CETIRIZINE-PSEUDOEPHEDRINE ER 5-120 MG PO TB12: 1.0000 | ORAL_TABLET | Freq: Every day | ORAL | Status: DC

## 2012-01-27 MED ORDER — ALBUTEROL SULFATE HFA 108 (90 BASE) MCG/ACT IN AERS
2.0000 | INHALATION_SPRAY | RESPIRATORY_TRACT | Status: DC | PRN
  Administered 2012-01-27: 2 via RESPIRATORY_TRACT
  Filled 2012-01-27: qty 6.7

## 2012-01-27 NOTE — ED Provider Notes (Signed)
Medical screening examination/treatment/procedure(s) were performed by non-physician practitioner and as supervising physician I was immediately available for consultation/collaboration.   Shaniah Baltes, MD 01/27/12 0546 

## 2012-01-27 NOTE — ED Notes (Signed)
Per pt's family member, the pt has been having a cough x 2 weeks that has gotten worse tonight.  Pt is having pain when he coughs and a sore throat.

## 2012-01-27 NOTE — ED Provider Notes (Signed)
History     CSN: [id number]  Arrival date & time 01/27/12  0120   First MD Initiated Contact with Patient 01/27/12 0201      Chief Complaint  Patient presents with  . Cough   HPI  History provided by the patient's son. Patient is a 71 year old [place] male who presents with complaints of persistent cough for the past 2 weeks. Coughing has been productive of yellow and green phlegm at times. Cough is particularly worse in the evening times and when trying to lie flat and sleep. Patient has had some congestion and postnasal drip as well. He has been using over-the-counter cough and cold medications without any significant relief. Patient was having significant difficulty sleeping the past few nights. Symptoms are associated with increased fatigue and drowsiness. Patient denies any fever, chills or sweats. Denies any shortness of breath or chest pains.    Past Medical History  Diagnosis Date  . GERD (gastroesophageal reflux disease)     History reviewed. No pertinent past surgical history.  History reviewed. No pertinent family history.  History  Substance Use Topics  . Smoking status: Never Smoker   . Smokeless tobacco: Not on file  . Alcohol Use: No      Review of Systems  Constitutional: Negative for fever, chills and diaphoresis.  HENT: Positive for congestion.   Respiratory: Positive for cough. Negative for shortness of breath.   Cardiovascular: Negative for chest pain.  Gastrointestinal: Negative for vomiting, abdominal pain and diarrhea.  All other systems reviewed and are negative.    Allergies  Review of patient's allergies indicates no known allergies.  Home Medications   Current Outpatient Rx  Name  Route  Sig  Dispense  Refill  . ESOMEPRAZOLE MAGNESIUM 40 MG PO CPDR   Oral   Take 40 mg by mouth daily before breakfast.         . LORATADINE 10 MG PO TABS   Oral   Take 10 mg by mouth at bedtime.         Doreatha Martin D COLD/FLU PO   Oral   Take  15 mLs by mouth 2 (two) times daily. For cold & flu symptoms           BP 127/76  Pulse 64  Temp 98.3 F (36.8 C) (Oral)  Resp 15  Ht 5\' 5"  (1.651 m)  Wt 154 lb (69.854 kg)  BMI 25.63 kg/m2  SpO2 100%  Physical Exam  Nursing note and vitals reviewed. Constitutional: He is oriented to person, place, and time. He appears well-developed and well-nourished. No distress.  HENT:  Head: Normocephalic.  Mouth/Throat: Oropharynx is clear and moist.  Neck: Normal range of motion. Neck supple.       No meningeal signs  Cardiovascular: Normal rate and regular rhythm.   No murmur heard. Pulmonary/Chest: Effort normal and breath sounds normal. No respiratory distress. He has no wheezes. He has no rales.  Abdominal: Soft.  Musculoskeletal: Normal range of motion. He exhibits no edema and no tenderness.       No clinical signs for DVT  Neurological: He is alert and oriented to person, place, and time.  Skin: Skin is warm.  Psychiatric: He has a normal mood and affect. His behavior is normal.    ED Course  Procedures   Dg Chest 2 View  01/27/2012  *RADIOLOGY REPORT*  Clinical Data: Cough, chest pain, shortness of breath.  CHEST - 2 VIEW  Comparison: 06/24/2011  Findings: Heart and mediastinal  contours are within normal limits. No focal opacities or effusions.  No acute bony abnormality.  IMPRESSION: No active cardiopulmonary disease.   Original Report Authenticated By: Charlett Nose, M.D.      1. Bronchitis   2. Cough       MDM  2:30 AM patient seen and evaluated. Patient is well-appearing in no acute distress. He has normal respirations and O2 sats. Lung sounds are clear. Symptoms have been present for the past several days. Symptoms consistent for bronchitis.  Patient given albuterol inhaler to use at home. We'll also give prescriptions to help with symptomatic treatment of persistent coughing.        Angus Seller, [place] 01/27/12 [phone]

## 2012-01-27 NOTE — ED Notes (Addendum)
Pt from home with c/o non productive cough x 2 weeks that is causing sleep disturbance and pleuritic pain with coughing. Pt also c/o sore throat. Patient alert and oriented with family member at bedside for translation of vienamese.

## 2013-11-02 ENCOUNTER — Emergency Department (HOSPITAL_COMMUNITY): Payer: Medicaid Other

## 2013-11-02 ENCOUNTER — Encounter (HOSPITAL_COMMUNITY): Payer: Self-pay | Admitting: Emergency Medicine

## 2013-11-02 ENCOUNTER — Emergency Department (HOSPITAL_COMMUNITY)
Admission: EM | Admit: 2013-11-02 | Discharge: 2013-11-02 | Disposition: A | Discharge status: Home / Self Care | Payer: Medicaid Other | Attending: Emergency Medicine | Admitting: Emergency Medicine

## 2013-11-02 DIAGNOSIS — S20219A Contusion of unspecified front wall of thorax, initial encounter: Secondary | ICD-10-CM | POA: Diagnosis not present

## 2013-11-02 DIAGNOSIS — S60519A Abrasion of unspecified hand, initial encounter: Secondary | ICD-10-CM

## 2013-11-02 DIAGNOSIS — K219 Gastro-esophageal reflux disease without esophagitis: Secondary | ICD-10-CM | POA: Diagnosis not present

## 2013-11-02 DIAGNOSIS — S298XXA Other specified injuries of thorax, initial encounter: Secondary | ICD-10-CM | POA: Diagnosis present

## 2013-11-02 DIAGNOSIS — IMO0002 Reserved for concepts with insufficient information to code with codable children: Secondary | ICD-10-CM | POA: Insufficient documentation

## 2013-11-02 DIAGNOSIS — Y9289 Other specified places as the place of occurrence of the external cause: Secondary | ICD-10-CM | POA: Diagnosis not present

## 2013-11-02 DIAGNOSIS — S80212A Abrasion, left knee, initial encounter: Secondary | ICD-10-CM

## 2013-11-02 DIAGNOSIS — S0990XA Unspecified injury of head, initial encounter: Secondary | ICD-10-CM | POA: Diagnosis not present

## 2013-11-02 DIAGNOSIS — S20212A Contusion of left front wall of thorax, initial encounter: Secondary | ICD-10-CM

## 2013-11-02 DIAGNOSIS — Z79899 Other long term (current) drug therapy: Secondary | ICD-10-CM | POA: Insufficient documentation

## 2013-11-02 DIAGNOSIS — Y9389 Activity, other specified: Secondary | ICD-10-CM | POA: Insufficient documentation

## 2013-11-02 MED ORDER — TRAMADOL HCL 50 MG PO TABS: 50.0000 mg | ORAL_TABLET | Freq: Four times a day (QID) | ORAL | Status: DC | PRN

## 2013-11-02 MED ORDER — HYDROCODONE-ACETAMINOPHEN 5-325 MG PO TABS
1.0000 | ORAL_TABLET | Freq: Once | ORAL | Status: AC
  Administered 2013-11-02: 1 via ORAL
  Filled 2013-11-02: qty 1

## 2013-11-02 MED ORDER — HYDROGEN PEROXIDE 3 % EX SOLN
CUTANEOUS | Status: AC
  Filled 2013-11-02: qty 473

## 2013-11-02 NOTE — Discharge Instructions (Signed)
Tramadol for pain. Bacitracin to all cuts twice a day. Follow up with primary care doctor for recheck. X-rays and CT did not show any broken bones.     Tr?y Da (Abrasion) Tr?y da l m?t v?t c?t ho?c v?t x??c trn da. Tr?y da [employer] pht tri?n qua t?t c? cc l?p da v th??ng lnh trong vng 10 ngy. ?i?u quan tr?ng l ch?m [employer] ch? tr?y ?ng cch ?? ng?n ng?a nhi?m trng. NGUYN NHN H?u h?t cc v?t tr?y da l do t ng ho?c tr??t qua n?n ??t ho?c b? m?t khc. Khi da b?n ch ln m?t ci g ?, l?p da bn ngoi v bn trong b? m?t ?i, gy tr?y x??c. CH?N ?ON Chuyn gia ch?m [place] s?c kh?e s? c th? ch?n ?on m?t v?t tr?y da trong qu trnh khm th?c th?. ?I?U TR? ?i?u tr? ph? thu?c vo ?? l?n v ?? su c?a v?t tr?y. Ni chung, v?t tr?y c?a b?n s? ???c lm s?ch b?ng n??c v x phng nh? ?? lo?i b? m?i b?i b?n ho?c m?nh v?n. Thu?c m? khng sinh c th? ???c thoa ln v?t tr?y ?? ng?n ng?a nhi?m trng. B?ng (b?ng b) c th? ???c cu?n xung quanh v?t tr?y ?? gi? cho n kh?i b? b?n. B?n c th? c?n ???c tim phng u?n vn n?u:  B?n khng th? nh? l?n tim phng u?n vn g?n ?y nh?t c?a b?n l khi no.  B?n ch?a bao gi? tim phng u?n vn.  Ch? b? th??ng ? lm rch da b?n. N?u b?n ???c tim phng u?n vn, cnh tay c?a b?n c th? b? s?ng, ?? v c?m th?y ?m khi ch?m vo. ?i?u ny l ph? bi?n v khng ph?i l m?t v?n ??. N?u b?n c?n tim phng u?n vn v b?n ch?n khng tim, hi?m khi c nguy c? b? u?n vn. B?nh do u?n vn c th? nghim tr?ng. H??NG D?N CH?M [place] T?I NH  N?u ? ???c b?ng l?i, hy thay b?ng t nh?t m?t l?n m?i ngy ho?c theo ch? d?n c?a chuyn gia ch?m [place] s?c kh?e. N?u b?ng b? dnh, hy ngm n vo n??c ?m.  R?a vng b? tr?y b?ng n??c v x phng nh? ?? lo?i b? h?t thu?c m? 2 l?n m?t ngy. R?a s?ch x phng v th?m kh vng b? tr?y b?ng kh?n s?ch.  Thoa l?i thu?c m? theo ch? d?n c?a chuyn gia ch?m [place] s?c kh?e. Lm nh? v?y s? gip ng?n ng?a nhi?m trng v gi? cho b?ng khng b? dnh. S? d?ng  g?c ??p trn v?t th??ng v ?? d??i ph?n b?ng ?? gi? cho ph?n b?ng cu?n khng b? dnh.  Thay b?ng ngay l?p t?c n?u b?ng b? ??t ho?c b?n.  Ch? s? d?ng thu?c khng c?n k toa ho?c thu?c c?n k toa ?? gi?m ?au, gi?m c?m gic kh ch?u ho?c h? s?t theo ch? d?n c?a chuyn gia ch?m [place] s?c kh?e c?a b?n.  G?p chuyn gia ch?m [place] s?c kh?e ?? khm l?i trong vng 24-48 gi? ?? ki?m tra v?t th??ng, ho?c theo ch? d?n. N?u b?n ? khng ???c h?n ki?m tra v?t th??ng, hy xem xt k? v?t tr?y xem n c b? t?y ??, s?ng hay c m? khng. ?y l nh?ng d?u hi?u nhi?m trng. HY NGAY L?P T?C ?I KHM N?U:  B?n b? ?au t?ng ln ? v?t th??ng.  B?n b? t?y ??, s?ng ho?c nh?y c?m ?au quanh v?t th??ng.  C m? ch?y ra t? v?t th??ng.  B?n b? s?t ho?c c cc tri?u ch?ng ko di h?n 2-3 ngy.  B?n b? s?t v cc tri?u ch?ng c?a b?n ??t nhin x?u ?i.  B?n c mi [place] ra t? v?t th??ng ho?c b?ng. ??M B?O B?N:  Hi?u cc h??ng d?n ny.  S? theo di tnh tr?ng c?a mnh.  S? yu c?u tr? gip ngay l?p t?c n?u b?n c?m th?y khng kh?e ho?c tnh tr?ng x?u ?i. Document Released: 02/05/2005 Document Revised: 10/08/2012 [facility] Patient Information 2015 [place], [place]. This information is not intended to replace advice given to you by your health care provider. Make sure you discuss any questions you have with your health care provider.
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
## 2013-11-02 NOTE — ED Notes (Signed)
Bed: [facility] Expected date:  Expected time:  Means of arrival:  Comments: 
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
## 2013-11-03 NOTE — ED Provider Notes (Signed)
Medical screening examination/treatment/procedure(s) were performed by non-physician practitioner and as supervising physician I was immediately available for consultation/collaboration.  Haruko Mersch T Brindle Leyba, MD 11/03/13 1625 

## 2015-10-03 ENCOUNTER — Emergency Department (HOSPITAL_COMMUNITY): Payer: Medicaid Other

## 2015-10-03 ENCOUNTER — Emergency Department (HOSPITAL_COMMUNITY)
Admission: EM | Admit: 2015-10-03 | Discharge: 2015-10-03 | Disposition: A | Discharge status: Home / Self Care | Payer: Medicaid Other | Attending: Emergency Medicine | Admitting: Emergency Medicine

## 2015-10-03 ENCOUNTER — Encounter (HOSPITAL_COMMUNITY): Payer: Self-pay | Admitting: Emergency Medicine

## 2015-10-03 DIAGNOSIS — S01111A Laceration without foreign body of right eyelid and periocular area, initial encounter: Secondary | ICD-10-CM | POA: Insufficient documentation

## 2015-10-03 DIAGNOSIS — S80211A Abrasion, right knee, initial encounter: Secondary | ICD-10-CM | POA: Insufficient documentation

## 2015-10-03 DIAGNOSIS — Y9389 Activity, other specified: Secondary | ICD-10-CM | POA: Insufficient documentation

## 2015-10-03 DIAGNOSIS — Y92410 Unspecified street and highway as the place of occurrence of the external cause: Secondary | ICD-10-CM | POA: Insufficient documentation

## 2015-10-03 DIAGNOSIS — S60512A Abrasion of left hand, initial encounter: Secondary | ICD-10-CM | POA: Diagnosis not present

## 2015-10-03 DIAGNOSIS — Z791 Long term (current) use of non-steroidal anti-inflammatories (NSAID): Secondary | ICD-10-CM | POA: Diagnosis not present

## 2015-10-03 DIAGNOSIS — S0993XA Unspecified injury of face, initial encounter: Secondary | ICD-10-CM

## 2015-10-03 DIAGNOSIS — S80212A Abrasion, left knee, initial encounter: Secondary | ICD-10-CM | POA: Diagnosis not present

## 2015-10-03 DIAGNOSIS — S60511A Abrasion of right hand, initial encounter: Secondary | ICD-10-CM | POA: Insufficient documentation

## 2015-10-03 DIAGNOSIS — Z79899 Other long term (current) drug therapy: Secondary | ICD-10-CM | POA: Diagnosis not present

## 2015-10-03 DIAGNOSIS — Y999 Unspecified external cause status: Secondary | ICD-10-CM | POA: Insufficient documentation

## 2015-10-03 DIAGNOSIS — S0990XA Unspecified injury of head, initial encounter: Secondary | ICD-10-CM | POA: Diagnosis present

## 2015-10-03 MED ORDER — OXYCODONE-ACETAMINOPHEN 5-325 MG PO TABS
2.0000 | ORAL_TABLET | Freq: Once | ORAL | Status: AC
  Administered 2015-10-03: 2 via ORAL
  Filled 2015-10-03: qty 2

## 2015-10-03 MED ORDER — BACITRACIN ZINC 500 UNIT/GM EX OINT
TOPICAL_OINTMENT | CUTANEOUS | Status: AC
  Administered 2015-10-03: 1
  Filled 2015-10-03: qty 0.9

## 2015-10-03 NOTE — ED Triage Notes (Signed)
James MD at bedside. 

## 2015-10-03 NOTE — ED Notes (Signed)
Wound care completed, Bacitracin applied

## 2015-10-03 NOTE — Discharge Instructions (Signed)
Follow-up with your primary care physician for suture removal in 5-7 days.
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
## 2015-10-10 ENCOUNTER — Encounter (HOSPITAL_COMMUNITY): Payer: Self-pay | Admitting: Emergency Medicine

## 2015-10-10 ENCOUNTER — Ambulatory Visit (HOSPITAL_COMMUNITY)
Admission: EM | Admit: 2015-10-10 | Discharge: 2015-10-10 | Disposition: A | Discharge status: Home / Self Care | Payer: Medicaid Other

## 2015-10-10 DIAGNOSIS — Z5189 Encounter for other specified aftercare: Secondary | ICD-10-CM

## 2015-10-10 NOTE — Discharge Instructions (Signed)
Return as neededs.

## 2015-10-10 NOTE — ED Provider Notes (Signed)
[facility]    CSN: [id number] date & time: 10/10/15  1049  First Provider Contact:  First MD Initiated Contact with Patient 10/10/15 1245        History   Chief Complaint Chief Complaint  Patient presents with  . Wound Check    HPI Binnie Kandong Tullos is a 75 y.o. male.    Wound Check  This is a new problem. The current episode started more than 1 week ago. The problem has been resolved. Associated symptoms comments: Facial wound healed, sutures intact..    Past Medical History:  Diagnosis Date  . GERD (gastroesophageal reflux disease)     Patient Active Problem List   Diagnosis Date Noted  . Syncope and collapse 06/24/2011  . Rib fracture 06/24/2011    History reviewed. No pertinent surgical history.     Home Medications    Prior to Admission medications   Medication Sig Start Date End Date Taking? Authorizing Provider  ibuprofen (ADVIL,MOTRIN) 200 MG tablet Take 400 mg by mouth daily as needed for moderate pain.   Yes Historical Provider, MD  diphenhydrAMINE (BENADRYL) 25 MG tablet Take 25 mg by mouth daily as needed for allergies.    Historical Provider, MD    Family History History reviewed. No pertinent family history.  Social History Social History  Substance Use Topics  . Smoking status: Never Smoker  . Smokeless tobacco: Never Used  . Alcohol use No     Allergies   Review of patient's allergies indicates no known allergies.   Review of Systems Review of Systems  Constitutional: Negative.   Skin: Positive for wound.  Neurological: Negative.   All other systems reviewed and are negative.    Physical Exam Triage Vital Signs ED Triage Vitals  Enc Vitals Group     BP 10/10/15 1210 116/64     Pulse Rate 10/10/15 1210 63     Resp 10/10/15 1210 16     Temp 10/10/15 1210 97.5 F (36.4 C)     Temp Source 10/10/15 1210 Oral     SpO2 10/10/15 1210 99 %     Weight --      Height --      Head Circumference --      Peak Flow  --      Pain Score 10/10/15 1224 4     Pain Loc --      Pain Edu? --      Excl. in GC? --    No data found.   Updated Vital Signs BP 116/64 (BP Location: Left Arm)   Pulse 63   Temp 97.5 F (36.4 C) (Oral)   Resp 16   SpO2 99%   Visual Acuity Right Eye Distance:   Left Eye Distance:   Bilateral Distance:    Right Eye Near:   Left Eye Near:    Bilateral Near:     Physical Exam  Constitutional: He appears well-developed and well-nourished. No distress.  Skin: No erythema.  Stitches removed. Wound healed.no complaints.  Nursing note and vitals reviewed.    UC Treatments / Results  Labs (all labs ordered are listed, but only abnormal results are displayed) Labs Reviewed - No data to display  EKG  EKG Interpretation None       Radiology No results found.  Procedures Procedures (including critical care time)  Medications Ordered in UC Medications - No data to display   Initial Impression / Assessment and Plan / UC Course  I have  reviewed the triage vital signs and the nursing notes.  Pertinent labs & imaging results that were available during my care of the patient were reviewed by me and considered in my medical decision making (see chart for details).  Clinical Course      Final Clinical Impressions(s) / UC Diagnoses   Final diagnoses:  None    New Prescriptions New Prescriptions   No medications on file     Linna HoffJames D Carel Schnee, MD 10/10/15 1251

## 2015-10-10 NOTE — ED Notes (Signed)
Sutures removed and bacitracin placed on face.
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
## 2018-01-31 ENCOUNTER — Other Ambulatory Visit: Payer: Self-pay

## 2018-01-31 ENCOUNTER — Encounter (HOSPITAL_COMMUNITY): Payer: Self-pay

## 2018-01-31 ENCOUNTER — Emergency Department (HOSPITAL_COMMUNITY)
Admission: EM | Admit: 2018-01-31 | Discharge: 2018-01-31 | Disposition: A | Discharge status: Home / Self Care | Payer: Medicare Other | Attending: Emergency Medicine | Admitting: Emergency Medicine

## 2018-01-31 ENCOUNTER — Emergency Department (HOSPITAL_COMMUNITY): Payer: Medicare Other

## 2018-01-31 DIAGNOSIS — B9789 Other viral agents as the cause of diseases classified elsewhere: Secondary | ICD-10-CM

## 2018-01-31 DIAGNOSIS — J069 Acute upper respiratory infection, unspecified: Secondary | ICD-10-CM

## 2018-01-31 DIAGNOSIS — R0789 Other chest pain: Secondary | ICD-10-CM | POA: Insufficient documentation

## 2018-01-31 DIAGNOSIS — I10 Essential (primary) hypertension: Secondary | ICD-10-CM | POA: Diagnosis not present

## 2018-01-31 DIAGNOSIS — R079 Chest pain, unspecified: Secondary | ICD-10-CM | POA: Diagnosis present

## 2018-01-31 HISTORY — DX: Essential (primary) hypertension: I10

## 2018-01-31 LAB — CBC WITH DIFFERENTIAL/PLATELET
ABS IMMATURE GRANULOCYTES: 0.04 10*3/uL (ref 0.00–0.07)
Basophils Absolute: 0 10*3/uL (ref 0.0–0.1)
Basophils Relative: 0 %
Eosinophils Absolute: 0.3 10*3/uL (ref 0.0–0.5)
Eosinophils Relative: 3 %
HCT: 40 % (ref 39.0–52.0)
Hemoglobin: 13 g/dL (ref 13.0–17.0)
IMMATURE GRANULOCYTES: 0 %
Lymphocytes Relative: 33 %
Lymphs Abs: 3 10*3/uL (ref 0.7–4.0)
MCH: 29.1 pg (ref 26.0–34.0)
MCHC: 32.5 g/dL (ref 30.0–36.0)
MCV: 89.5 fL (ref 80.0–100.0)
Monocytes Absolute: 0.8 10*3/uL (ref 0.1–1.0)
Monocytes Relative: 8 %
NEUTROS ABS: 5.2 10*3/uL (ref 1.7–7.7)
NRBC: 0 % (ref 0.0–0.2)
Neutrophils Relative %: 56 %
Platelets: 269 10*3/uL (ref 150–400)
RBC: 4.47 MIL/uL (ref 4.22–5.81)
RDW: 12.4 % (ref 11.5–15.5)
WBC: 9.3 10*3/uL (ref 4.0–10.5)

## 2018-01-31 LAB — BASIC METABOLIC PANEL
Anion gap: 12 (ref 5–15)
BUN: 11 mg/dL (ref 8–23)
CO2: 23 mmol/L (ref 22–32)
Calcium: 9.3 mg/dL (ref 8.9–10.3)
Chloride: 101 mmol/L (ref 98–111)
Creatinine, Ser: 0.89 mg/dL (ref 0.61–1.24)
GFR calc Af Amer: >60 mL/min (ref >60)
GFR calc non Af Amer: >60 mL/min (ref >60)
Glucose, Bld: 102 mg/dL — ABNORMAL HIGH (ref 70–99)
POTASSIUM: 4 mmol/L (ref 3.5–5.1)
Sodium: 136 mmol/L (ref 135–145)

## 2018-01-31 LAB — INFLUENZA PANEL BY PCR (TYPE A & B)
Influenza A By PCR: NEGATIVE
Influenza B By PCR: NEGATIVE

## 2018-01-31 LAB — I-STAT TROPONIN, ED: Troponin i, poc: 0.01 ng/mL (ref 0.00–0.08)

## 2018-01-31 MED ORDER — GUAIFENESIN 100 MG/5ML PO LIQD: 100.0000 mg | ORAL | 0 refills | Status: AC | PRN

## 2018-01-31 MED ORDER — ACETAMINOPHEN 500 MG PO TABS
1000.0000 mg | ORAL_TABLET | Freq: Once | ORAL | Status: AC
  Administered 2018-01-31: 1000 mg via ORAL
  Filled 2018-01-31: qty 2

## 2018-01-31 MED ORDER — BENZONATATE 100 MG PO CAPS: 100.0000 mg | ORAL_CAPSULE | Freq: Three times a day (TID) | ORAL | 0 refills | Status: AC | PRN

## 2018-01-31 MED ORDER — GUAIFENESIN 100 MG/5ML PO SOLN
5.0000 mL | Freq: Once | ORAL | Status: AC
  Administered 2018-01-31: 100 mg via ORAL
  Filled 2018-01-31: qty 5

## 2018-01-31 NOTE — ED Notes (Signed)
Pt assessed with the interpreter. Pt is feeling better since being given the tylenol and cough medicine. Pt being assisted to urinate in urinal at bedside.

## 2018-01-31 NOTE — ED Notes (Signed)
[facility] here to get pt  

## 2018-01-31 NOTE — ED Provider Notes (Addendum)
TIME SEEN: 6:02 AM  CHIEF COMPLAINT: chest pain, shortness of breath, fever, cough  HPI: Patient is a 77 year old male with history of hypertension, previous tobacco use who presents to the emergency department with complaints of subjective fevers, cough, dull moderate left-sided chest pain worse with coughing, feeling fatigued.  States he is feeling short of breath as well.  No history of CAD, CHF, PE, DVT.  States he has been taking NyQuil intermittently.  Last dose was midnight last night.  He has had a flu shot.  No sick contacts.  [place] travel was to [place] in 2016.  Denies vomiting or diarrhea.  No dysuria or hematuria.  Discussion completed with [place] interpreter.  ROS: See HPI Constitutional: Subjective fever  Eyes: no drainage  ENT: no runny nose   Cardiovascular:  chest pain  Resp: SOB  GI: no vomiting GU: no dysuria Integumentary: no rash  Allergy: no hives  Musculoskeletal: no leg swelling  Neurological: no slurred speech ROS otherwise negative  PAST MEDICAL HISTORY/PAST SURGICAL HISTORY:  Past Medical History:  Diagnosis Date  . GERD (gastroesophageal reflux disease)   . Hypertension     MEDICATIONS:  Prior to Admission medications   Medication Sig Start Date End Date Taking? Authorizing Provider  diphenhydrAMINE (BENADRYL) 25 MG tablet Take 25 mg by mouth daily as needed for allergies.    [provider]  ibuprofen (ADVIL,MOTRIN) 200 MG tablet Take 400 mg by mouth daily as needed for moderate pain.    [provider]    ALLERGIES:  No Known Allergies  SOCIAL HISTORY:  Social History   Tobacco Use  . Smoking status: Never Smoker  . Smokeless tobacco: Never Used  Substance Use Topics  . Alcohol use: No    FAMILY HISTORY: No family history on file.  EXAM: BP 129/85 (BP Location: Right Arm)   Pulse 72   Temp 98.2 F (36.8 C) (Oral)   Resp (!) 21   Ht 5\' 6"  (1.676 m)   Wt 69.4 kg   SpO2 99%   BMI 24.69 kg/m   CONSTITUTIONAL: Alert and oriented and responds appropriately to questions. Well-appearing; well-nourished HEAD: Normocephalic EYES: Conjunctivae clear, pupils appear equal, EOMI ENT: normal nose; moist mucous membranes NECK: Supple, no meningismus, no nuchal rigidity, no LAD  CARD: RRR; S1 and S2 appreciated; no murmurs, no clicks, no rubs, no gallops RESP: Normal chest excursion without splinting or tachypnea; breath sounds clear and equal bilaterally; no wheezes, no rhonchi, no rales, no hypoxia or respiratory distress, speaking full sentences ABD/GI: Normal bowel sounds; non-distended; soft, non-tender, no rebound, no guarding, no peritoneal signs, no hepatosplenomegaly BACK:  The back appears normal and is non-tender to palpation, there is no CVA tenderness EXT: Normal ROM in all joints; non-tender to palpation; no edema; normal capillary refill; no cyanosis, no calf tenderness or swelling    SKIN: Normal color for age and race; warm; no rash NEURO: Moves all extremities equally PSYCH: The patient's mood and manner are appropriate. Grooming and personal hygiene are appropriate.  MEDICAL DECISION MAKING: Patient here with complaints of chest pain, shortness of breath, cough, subjective fevers.  Suspect viral illness, possible influenza versus pneumonia.  Low suspicion for ACS, PE or dissection.  EKG shows no ischemic abnormality.  Will obtain labs including troponin, chest x-ray, flu swab.  Will treat with Tylenol, guaifenesin.  ED PROGRESS: Patient's labs are reassuring.  Negative troponin.  Chest x-ray clear.  Flu swabs are negative.  Suspect viral illness causing symptoms.  Chest pain seems very atypical.  I do not feel he needs serial cardiac enzymes.  Discussed supportive care instructions and return precautions.  Will discharge home.  [place] interpreter used throughout this visit.   At this time, I do not feel there is any life-threatening condition present. I have reviewed and  discussed all results (EKG, imaging, lab, urine as appropriate) and exam findings with patient/family. I have reviewed nursing notes and appropriate previous records.  I feel the patient is safe to be discharged home without further emergent workup and can continue workup as an outpatient as needed. Discussed usual and customary return precautions. Patient/family verbalize understanding and are comfortable with this plan.  Outpatient follow-up has been provided as needed. All questions have been answered.    Patient seems most concerned about receiving cough medication to help him sleep.  He states his primary care physician has given him a "red liquid" in the past that has helped him.  He cannot recall the name of this medicine.  Have recommended over-the-counter medication such as guaifenesin and dextromethorphan but he does not seem happy with this.  I have not heard the patient cough at all while being in the emergency department and he appears very comfortable.  I have discussed with him using the interpreter that narcotic medications can be dangerous to him and he can have many side effects especially given he is 77 years old.  I will give him a prescription for guaifenesin and Tessalon Perles.    EKG Interpretation  Date/Time:  Friday January 31 2018 05:55:02 EST Ventricular Rate:  73 PR Interval:    QRS Duration: 72 QT Interval:  361 QTC Calculation: 398 R Axis:   59 Text Interpretation:  Sinus rhythm No significant change since last tracing Confirmed by Rochele Raring [id number]) on 01/31/2018 6:02:49 AM Also confirmed by Jalexia Lalli, Baxter Hire [phone]), editor Jac Canavan, [place] [id number])  on 01/31/2018 6:53:23 AM         Hughes Wyndham, Layla Maw, DO 01/31/18 0727    Donoven Pett, Layla Maw, DO 01/31/18 [id number]

## 2018-01-31 NOTE — ED Triage Notes (Signed)
BIB EMS from home. Pt reporting cough, nasal congestion, sore throat for maybe a week. LS clear, SpO2 99%RA. Per EMS oral temp 98.61F.

## 2018-01-31 NOTE — Discharge Instructions (Addendum)
You may use Tylenol 1000 mg every 6 hours as needed for fever and pain. Please rest and drink plenty of fluids. This is a viral illness causing your symptoms. You do not need antibiotics for a virus. You may use over-the-counter nasal saline spray and Afrin nasal saline spray as needed for nasal congestion. Please do not use Afrin for more than 3 days in a row. You may use guaifenesin and dextromethorphan as needed for cough.  You may use lozenges and Chloraseptic spray to help with sore throat.  Warm salt water gargles can also help with sore throat.   Please note that some combination medicines such as DayQuil and NyQuil have multiple medications in them.  Please make sure you look at all labels to ensure that you are not taking too much of any one particular medication.  Symptoms from a virus may take 7-14 days to run its course.

## 2018-01-31 NOTE — ED Notes (Signed)
Transported to radiology 

## 2018-01-31 NOTE — ED Notes (Addendum)
This RN in room with Dr Elesa MassedWard and interpreter to explain results and d/c instructions. Pt informed that he has a virus, that there will be no antibiotics. Pt encouraged to take 1000mg  of tylenol every 6 hours for pain and fever and chloraseptic spray to throat as well as to push fluids as much as possible. Pt also encouraged to continue using nyquil or robitussin at night to help with sx and sleep and informed that he can use benadryl if needed if he doesn't have cough medicine. Pt verbalized understanding of d/c instructions and has no further questions, VSS, NAD. Pt was brought in by EMS and pt does not have anyone to come and get him to take him home. PTAR to be contacted. Pt lives by himself.

## 2018-01-31 NOTE — ED Triage Notes (Signed)
Lungs clear and equal normal heart tones, no bruits, no pedal edema noted extremely pulses present x4, non-productive dry cough.  Afebrile, non-english speaking translation services being used successfully. Patient describes dull chest pain about 5 of 10 x 2 days with SOB.
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
## 2018-06-23 ENCOUNTER — Encounter (HOSPITAL_COMMUNITY): Payer: Self-pay

## 2018-06-23 ENCOUNTER — Emergency Department (HOSPITAL_COMMUNITY)
Admission: EM | Admit: 2018-06-23 | Discharge: 2018-06-23 | Disposition: A | Discharge status: Home / Self Care | Payer: Medicare Other | Attending: Emergency Medicine | Admitting: Emergency Medicine

## 2018-06-23 ENCOUNTER — Other Ambulatory Visit: Payer: Self-pay

## 2018-06-23 ENCOUNTER — Emergency Department (HOSPITAL_COMMUNITY): Payer: Medicare Other

## 2018-06-23 DIAGNOSIS — I10 Essential (primary) hypertension: Secondary | ICD-10-CM | POA: Insufficient documentation

## 2018-06-23 DIAGNOSIS — R42 Dizziness and giddiness: Secondary | ICD-10-CM | POA: Diagnosis not present

## 2018-06-23 DIAGNOSIS — Z79899 Other long term (current) drug therapy: Secondary | ICD-10-CM | POA: Diagnosis not present

## 2018-06-23 DIAGNOSIS — R531 Weakness: Secondary | ICD-10-CM | POA: Insufficient documentation

## 2018-06-23 DIAGNOSIS — Z20828 Contact with and (suspected) exposure to other viral communicable diseases: Secondary | ICD-10-CM | POA: Insufficient documentation

## 2018-06-23 DIAGNOSIS — R0602 Shortness of breath: Secondary | ICD-10-CM | POA: Diagnosis present

## 2018-06-23 DIAGNOSIS — Z03818 Encounter for observation for suspected exposure to other biological agents ruled out: Secondary | ICD-10-CM

## 2018-06-23 LAB — CBC WITH DIFFERENTIAL/PLATELET
Abs Immature Granulocytes: 0.03 10*3/uL (ref 0.00–0.07)
Basophils Absolute: 0 10*3/uL (ref 0.0–0.1)
Basophils Relative: 0 %
Eosinophils Absolute: 0.1 10*3/uL (ref 0.0–0.5)
Eosinophils Relative: 1 %
HCT: 38.4 % — ABNORMAL LOW (ref 39.0–52.0)
Hemoglobin: 12.7 g/dL — ABNORMAL LOW (ref 13.0–17.0)
Immature Granulocytes: 0 %
Lymphocytes Relative: 18 %
Lymphs Abs: 2 10*3/uL (ref 0.7–4.0)
MCH: 30 pg (ref 26.0–34.0)
MCHC: 33.1 g/dL (ref 30.0–36.0)
MCV: 90.6 fL (ref 80.0–100.0)
Monocytes Absolute: 0.6 10*3/uL (ref 0.1–1.0)
Monocytes Relative: 6 %
Neutro Abs: 8.3 10*3/uL — ABNORMAL HIGH (ref 1.7–7.7)
Neutrophils Relative %: 75 %
Platelets: 236 10*3/uL (ref 150–400)
RBC: 4.24 MIL/uL (ref 4.22–5.81)
RDW: 12.4 % (ref 11.5–15.5)
WBC: 11.2 10*3/uL — ABNORMAL HIGH (ref 4.0–10.5)
nRBC: 0 % (ref 0.0–0.2)

## 2018-06-23 LAB — COMPREHENSIVE METABOLIC PANEL
ALT: 32 U/L (ref 0–44)
AST: 29 U/L (ref 15–41)
Albumin: 4 g/dL (ref 3.5–5.0)
Alkaline Phosphatase: 68 U/L (ref 38–126)
Anion gap: 10 (ref 5–15)
BUN: 12 mg/dL (ref 8–23)
CO2: 27 mmol/L (ref 22–32)
Calcium: 9.2 mg/dL (ref 8.9–10.3)
Chloride: 98 mmol/L (ref 98–111)
Creatinine, Ser: 0.88 mg/dL (ref 0.61–1.24)
GFR calc Af Amer: >60 mL/min (ref >60)
GFR calc non Af Amer: >60 mL/min (ref >60)
Glucose, Bld: 101 mg/dL — ABNORMAL HIGH (ref 70–99)
Potassium: 4.4 mmol/L (ref 3.5–5.1)
Sodium: 135 mmol/L (ref 135–145)
Total Bilirubin: 0.3 mg/dL (ref 0.3–1.2)
Total Protein: 7.4 g/dL (ref 6.5–8.1)

## 2018-06-23 LAB — D-DIMER, QUANTITATIVE: D-Dimer, Quant: <0.27 ug/mL-FEU (ref 0.00–0.50)

## 2018-06-23 LAB — TROPONIN I: Troponin I: <0.03 ng/mL (ref <0.03)

## 2018-06-23 MED ORDER — ALUM & MAG HYDROXIDE-SIMETH 200-200-20 MG/5ML PO SUSP
15.0000 mL | Freq: Once | ORAL | Status: AC
  Administered 2018-06-23: 15 mL via ORAL
  Filled 2018-06-23: qty 30

## 2018-06-23 NOTE — ED Triage Notes (Signed)
EMS reports from home, per neighbor (poor historian, wife out of town), SOB possible fever a week ago, weakness, dizzy, states Pt. not eating well. Pt requires translator [place]   BP 130/86 HR 90 RR 18 Sp02 98 RA CBG 110  18ga L forearm

## 2018-06-23 NOTE — ED Notes (Signed)
Bed: TT01 Expected date:  Expected time:  Means of arrival:  Comments: EMS 77yo fever cough, shob

## 2018-06-23 NOTE — ED Provider Notes (Signed)
[facility]-EMERGENCY DEPT Provider Note   CSN: [id number] Arrival date & time: 06/23/18  1457    History   Chief Complaint Chief Complaint  Patient presents with  . Shortness of Breath  . Dizziness  . Weakness    HPI Doulgas Budnick is a 78 y.o. male.     The history is provided by the patient and medical records. A language interpreter was used.    Randeep Krafft is a 78 y.o. male who presents to the Emergency Department complaining of sob.  Preferred language vietnamese or cambodian per son in law.  Level V caveat due to language barrier. He presents to the emergency department complaining of shortness of breath and dizziness that began about one hour prior to ED arrival. He had associated transient chest discomfort. He also complains of subjective fever and cough and feeling poorly for the last few days. He also complains of nausea and upset stomach for the last month and a half. No known sick contacts. He has no known medical problems and takes no medications. He lives alone. Symptoms are moderate to severe and constant nature.  Past Medical History:  Diagnosis Date  . GERD (gastroesophageal reflux disease)   . Hypertension     Patient Active Problem List   Diagnosis Date Noted  . Syncope and collapse 06/24/2011  . Rib fracture 06/24/2011    History reviewed. No pertinent surgical history.      Home Medications    Prior to Admission medications   Medication Sig Start Date End Date Taking? Authorizing Provider  omeprazole (PRILOSEC) 40 MG capsule Take 40 mg by mouth daily. 03/31/18  Yes [provider]  benzonatate (TESSALON) 100 MG capsule Take 1 capsule (100 mg total) by mouth 3 (three) times daily as needed for cough. Patient not taking: Reported on 06/23/2018 01/31/18   Ward, Layla Maw, DO  guaiFENesin (ROBITUSSIN) 100 MG/5ML liquid Take 5-10 mLs (100-200 mg total) by mouth every 4 (four) hours as needed for cough. Patient not taking: Reported on  06/23/2018 01/31/18   Ward, Layla Maw, DO    Family History History reviewed. No pertinent family history.  Social History Social History   Tobacco Use  . Smoking status: Never Smoker  . Smokeless tobacco: Never Used  Substance Use Topics  . Alcohol use: No  . Drug use: No     Allergies   Patient has no known allergies.   Review of Systems Review of Systems  All other systems reviewed and are negative.    Physical Exam Updated Vital Signs BP 117/63   Pulse 71   Temp 98.3 F (36.8 C) (Oral)   Resp 14   SpO2 99%   Physical Exam Vitals signs and nursing note reviewed.  Constitutional:      Appearance: He is well-developed.  HENT:     Head: Normocephalic and atraumatic.  Cardiovascular:     Rate and Rhythm: Normal rate and regular rhythm.  Pulmonary:     Effort: Pulmonary effort is normal. No respiratory distress.  Abdominal:     Palpations: Abdomen is soft.     Tenderness: There is no abdominal tenderness. There is no guarding or rebound.  Musculoskeletal:        General: No swelling or tenderness.  Skin:    General: Skin is warm and dry.  Neurological:     Mental Status: He is alert and oriented to person, place, and time.  Psychiatric:        Mood  and Affect: Mood normal.        Behavior: Behavior normal.      ED Treatments / Results  Labs (all labs ordered are listed, but only abnormal results are displayed) Labs Reviewed  COMPREHENSIVE METABOLIC PANEL - Abnormal; Notable for the following components:      Result Value   Glucose, Bld 101 (*)    All other components within normal limits  CBC WITH DIFFERENTIAL/PLATELET - Abnormal; Notable for the following components:   WBC 11.2 (*)    Hemoglobin 12.7 (*)    HCT 38.4 (*)    Neutro Abs 8.3 (*)    All other components within normal limits  NOVEL CORONAVIRUS, NAA (HOSPITAL ORDER, SEND-OUT TO REF LAB)  TROPONIN I  D-DIMER, QUANTITATIVE (NOT AT [facility])    EKG EKG Interpretation  Date/Time:   Monday Jun 23 2018 16:21:16 EDT Ventricular Rate:  73 PR Interval:    QRS Duration: 89 QT Interval:  377 QTC Calculation: 416 R Axis:   55 Text Interpretation:  Sinus rhythm Confirmed by Tilden Fossaees, Jaton Eilers [phone]) on 06/23/2018 4:36:36 PM   Radiology Dg Chest Port 1 View  Result Date: 06/23/2018 CLINICAL DATA:  SOB.  Possible fever. EXAM: PORTABLE CHEST 1 VIEW COMPARISON:  02/18/2018. FINDINGS: The heart size and mediastinal contours are within normal limits. Both lungs are clear. The visualized skeletal structures are unremarkable. IMPRESSION: No active disease.  No significant change from priors. Electronically Signed   By: Elsie StainJohn T Curnes M.D.   On: 06/23/2018 16:40    Procedures Procedures (including critical care time)  Medications Ordered in ED Medications  alum & mag hydroxide-simeth (MAALOX/MYLANTA) 200-200-20 MG/5ML suspension 15 mL (15 mLs Oral Given 06/23/18 1622)     Initial Impression / Assessment and Plan / ED Course  I have reviewed the triage vital signs and the nursing notes.  Pertinent labs & imaging results that were available during my care of the patient were reviewed by me and considered in my medical decision making (see chart for details).       Patient here for evaluation of subjective fever, cough and shortness of breath with near syncopal episode today. He is well appearing on evaluation with no acute distress. No evidence of acute pneumonia. Presentation is not consistent with ACS, CHF, PE. He has no known COVID exposures but given his advanced age, will send COVID screening. Plan to discharge home with close outpatient follow-up and return precautions.  Binnie Kandong Levesque was evaluated in Emergency Department on 06/23/2018 for the symptoms described in the history of present illness. He was evaluated in the context of the global COVID-19 pandemic, which necessitated consideration that the patient might be at risk for infection with the SARS-CoV-2 virus that causes COVID-19.  Institutional protocols and algorithms that pertain to the evaluation of patients at risk for COVID-19 are in a state of rapid change based on information released by regulatory bodies including the CDC and federal and state organizations. These policies and algorithms were followed during the patient's care in the ED.   Final Clinical Impressions(s) / ED Diagnoses   Final diagnoses:  Shortness of breath    ED Discharge Orders    None       Tilden Fossaees, Arael Piccione, MD 06/23/18 1758

## 2018-06-26 LAB — NOVEL CORONAVIRUS, NAA (HOSP ORDER, SEND-OUT TO REF LAB; TAT 18-24 HRS): SARS-CoV-2, NAA: NOT DETECTED

## 2020-06-21 IMAGING — CR DG CHEST 2V
2 series · 2 of 2 positions shown · non-contrast
Comparison: Chest radiograph dated 11/02/2013

CLINICAL DATA: 77-year-old male with cough and shortness of breath

EXAM:
CHEST - 2 VIEW

[chest lat]
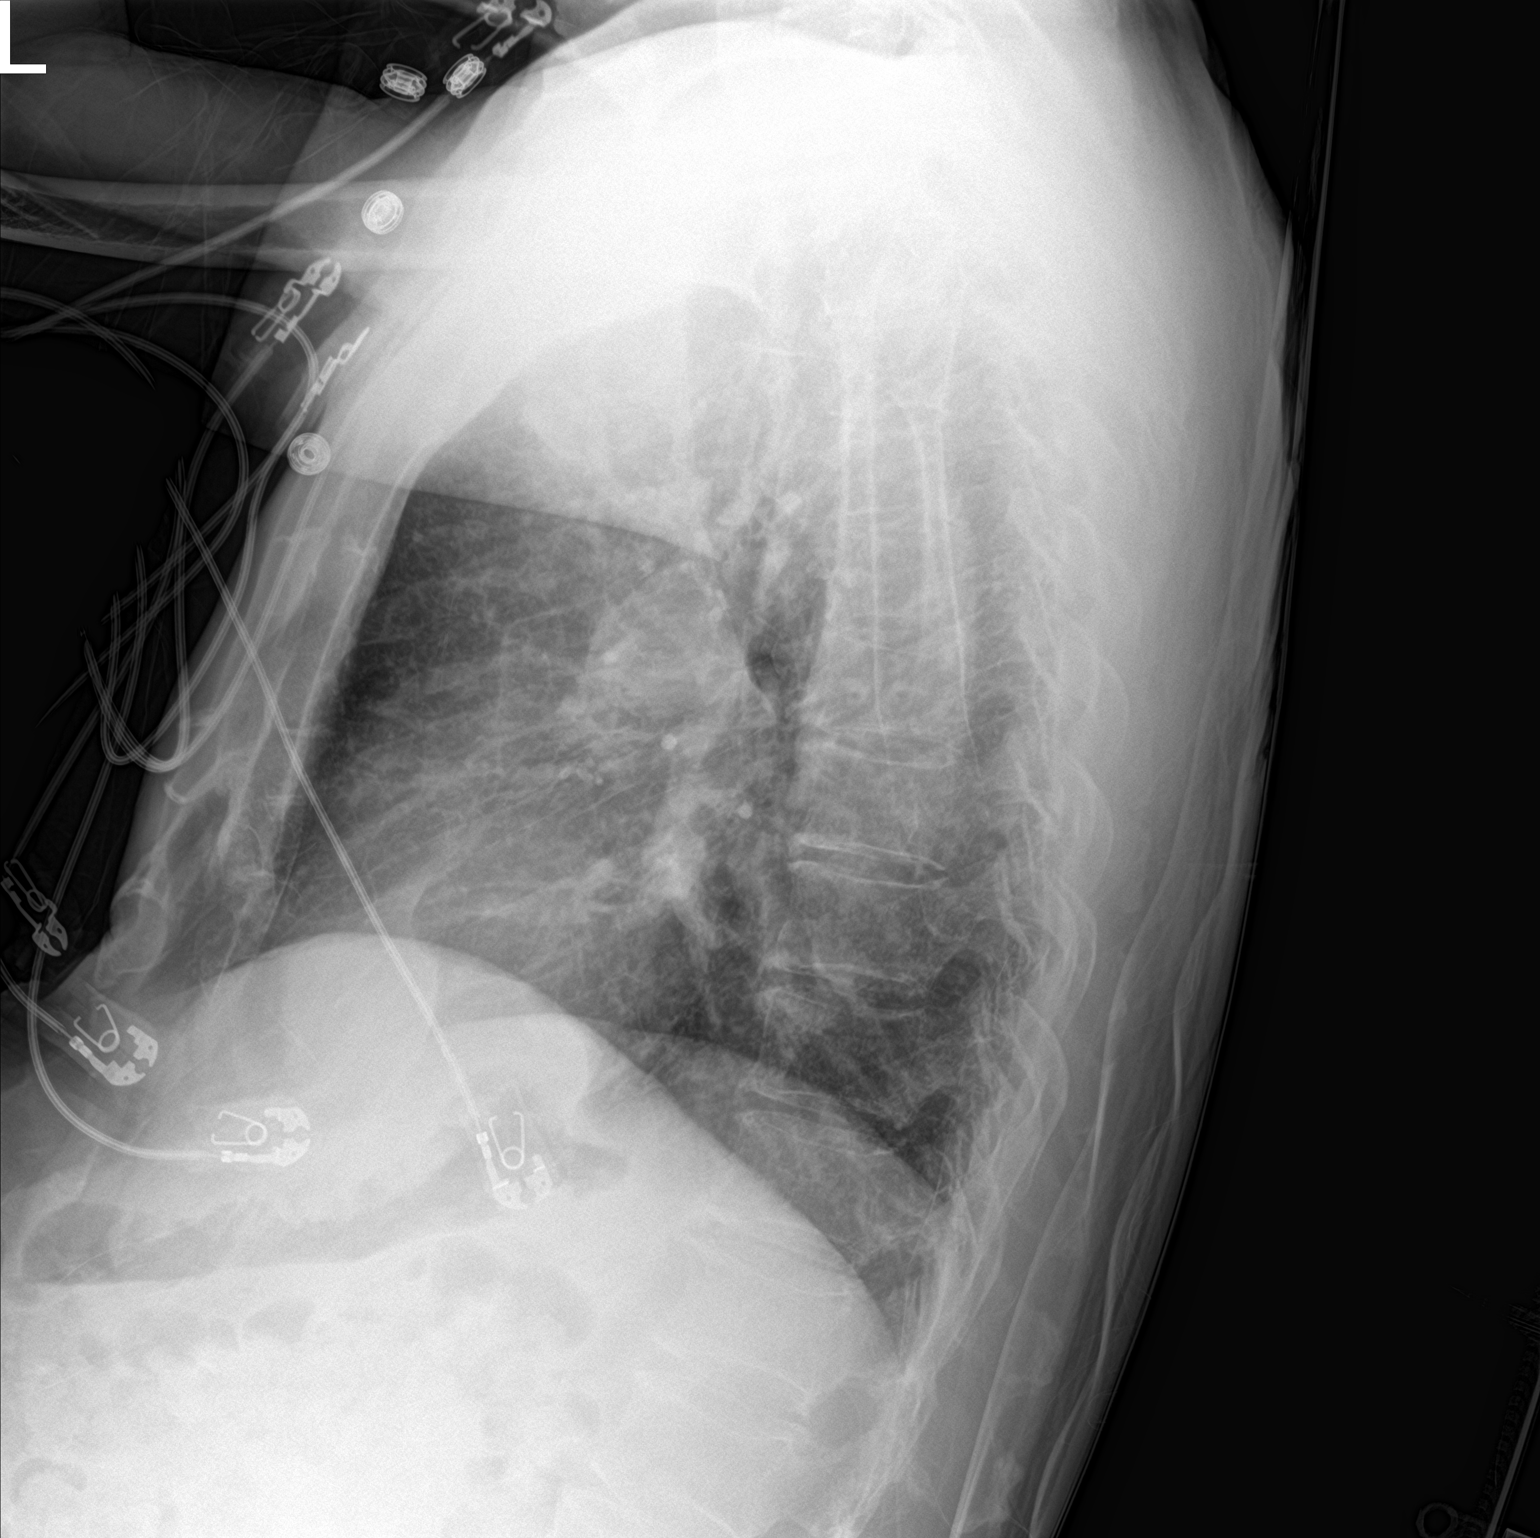

[chest ap]
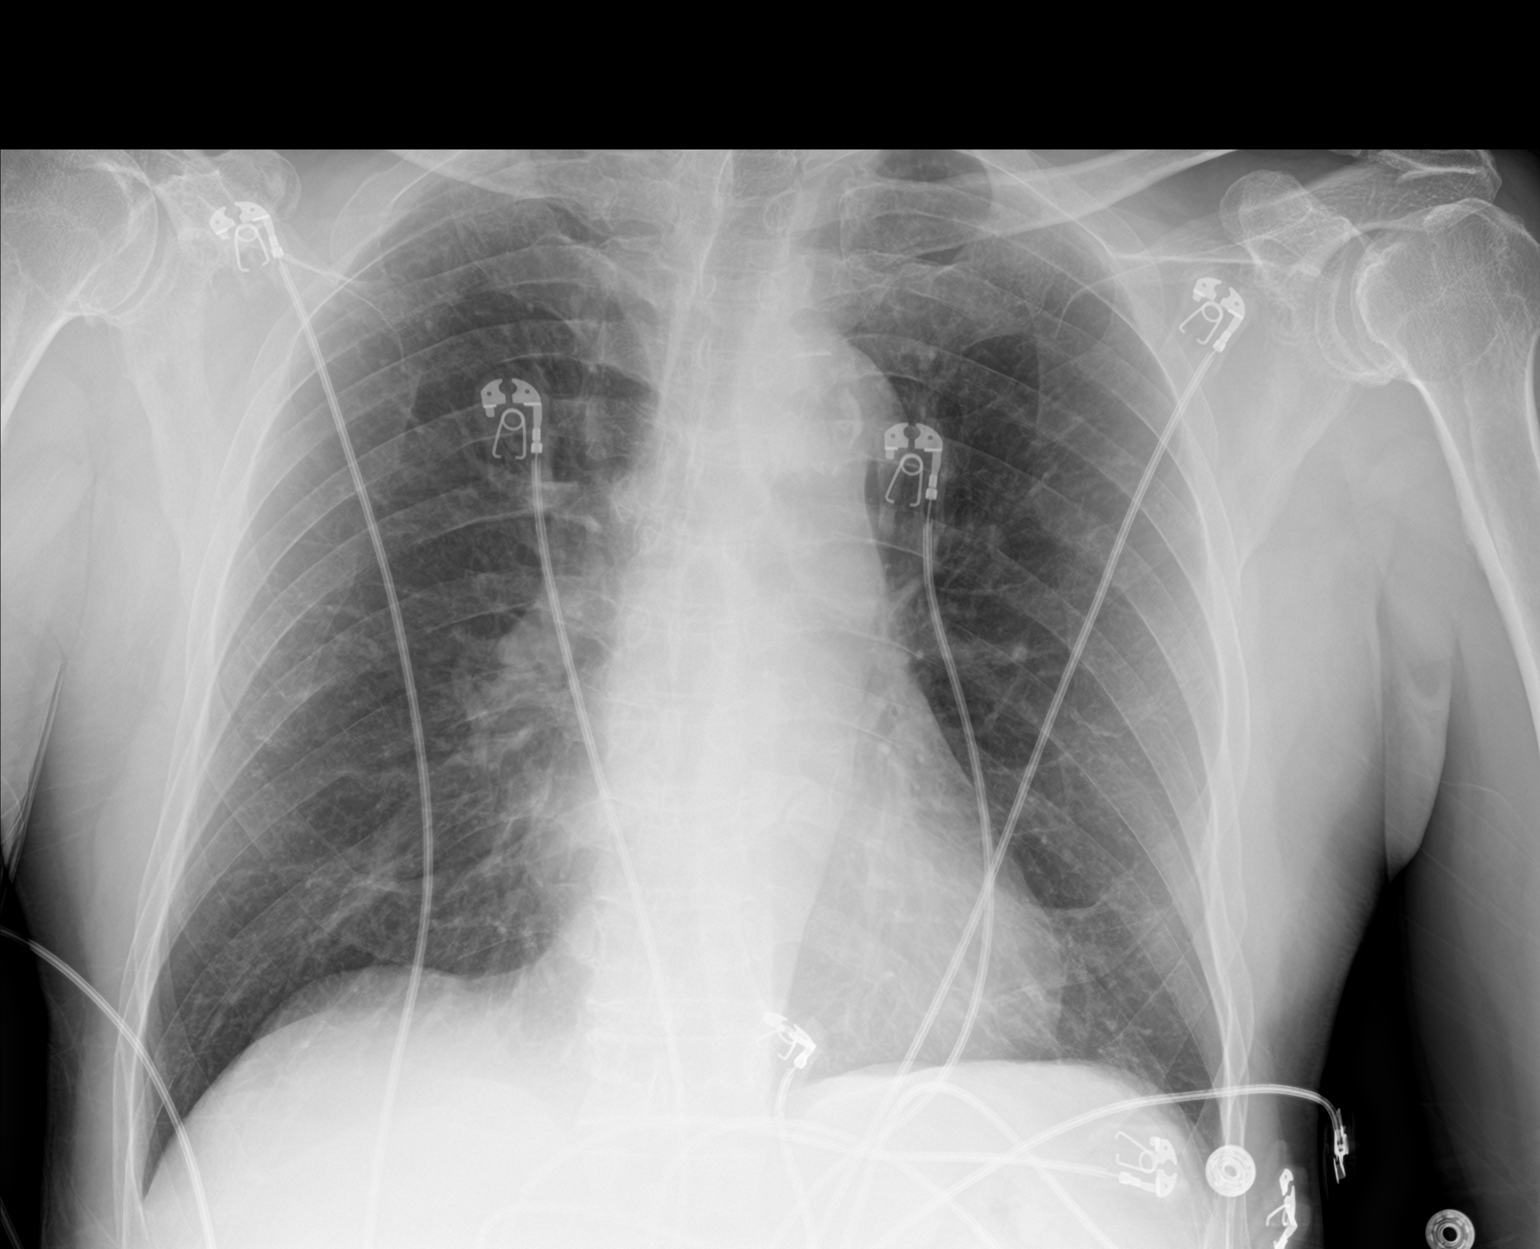

[2 of 2 positions shown; findings below may reference images not displayed]

FINDINGS: There is no focal consolidation, pleural effusion, or pneumothorax.
The cardiac silhouette is within normal limits. Atherosclerotic
calcification of the aortic arch. No acute osseous pathology.
IMPRESSION: No active cardiopulmonary disease.
